# Patient Record
Sex: Female | Born: 1945 | Race: White | Hispanic: No | Smoking: Never smoker
Health system: Southern US, Community
[De-identification: ages and names within clinical notes are randomized; demographics above are authoritative.]

## PROBLEM LIST (undated history)

## (undated) DIAGNOSIS — J45909 Unspecified asthma, uncomplicated: Secondary | ICD-10-CM

## (undated) DIAGNOSIS — M25559 Pain in unspecified hip: Secondary | ICD-10-CM

## (undated) DIAGNOSIS — J309 Allergic rhinitis, unspecified: Secondary | ICD-10-CM

## (undated) DIAGNOSIS — K5730 Diverticulosis of large intestine without perforation or abscess without bleeding: Secondary | ICD-10-CM

## (undated) DIAGNOSIS — F329 Major depressive disorder, single episode, unspecified: Secondary | ICD-10-CM

## (undated) DIAGNOSIS — I10 Essential (primary) hypertension: Secondary | ICD-10-CM

## (undated) DIAGNOSIS — K219 Gastro-esophageal reflux disease without esophagitis: Secondary | ICD-10-CM

## (undated) DIAGNOSIS — F32A Depression, unspecified: Secondary | ICD-10-CM

## (undated) DIAGNOSIS — E785 Hyperlipidemia, unspecified: Secondary | ICD-10-CM

## (undated) HISTORY — DX: Gastro-esophageal reflux disease without esophagitis: K21.9

## (undated) HISTORY — DX: Essential (primary) hypertension: I10

## (undated) HISTORY — PX: APPENDECTOMY: SHX54

## (undated) HISTORY — PX: OTHER SURGICAL HISTORY: SHX169

## (undated) HISTORY — DX: Allergic rhinitis, unspecified: J30.9

## (undated) HISTORY — DX: Diverticulosis of large intestine without perforation or abscess without bleeding: K57.30

## (undated) HISTORY — DX: Hyperlipidemia, unspecified: E78.5

## (undated) HISTORY — DX: Pain in unspecified hip: M25.559

## (undated) HISTORY — DX: Unspecified asthma, uncomplicated: J45.909

---

## 1994-10-01 HISTORY — PX: TOTAL KNEE ARTHROPLASTY: SHX125

## 1994-10-01 HISTORY — PX: HYSTEROSCOPY: SHX211

## 1999-02-28 ENCOUNTER — Encounter: Admission: RE | Admit: 1999-02-28 | Discharge: 1999-05-29 | Payer: Self-pay | Admitting: *Deleted

## 1999-04-06 ENCOUNTER — Inpatient Hospital Stay (HOSPITAL_COMMUNITY): Admission: EM | Admit: 1999-04-06 | Discharge: 1999-04-08 | Payer: Self-pay | Admitting: *Deleted

## 1999-04-06 ENCOUNTER — Encounter: Payer: Self-pay | Admitting: *Deleted

## 1999-07-18 ENCOUNTER — Encounter: Payer: Self-pay | Admitting: Gastroenterology

## 1999-09-04 ENCOUNTER — Ambulatory Visit (HOSPITAL_COMMUNITY): Admission: RE | Admit: 1999-09-04 | Discharge: 1999-09-04 | Payer: Self-pay | Admitting: Gastroenterology

## 2000-07-02 ENCOUNTER — Encounter: Admission: RE | Admit: 2000-07-02 | Discharge: 2000-07-02 | Payer: Self-pay | Admitting: *Deleted

## 2000-07-02 ENCOUNTER — Encounter: Payer: Self-pay | Admitting: *Deleted

## 2001-09-26 ENCOUNTER — Other Ambulatory Visit: Admission: RE | Admit: 2001-09-26 | Discharge: 2001-09-26 | Payer: Self-pay | Admitting: *Deleted

## 2002-01-12 ENCOUNTER — Emergency Department (HOSPITAL_COMMUNITY): Admission: EM | Admit: 2002-01-12 | Discharge: 2002-01-12 | Payer: Self-pay | Admitting: Emergency Medicine

## 2002-01-12 ENCOUNTER — Encounter: Payer: Self-pay | Admitting: Emergency Medicine

## 2002-04-17 ENCOUNTER — Encounter: Admission: RE | Admit: 2002-04-17 | Discharge: 2002-04-17 | Payer: Self-pay | Admitting: *Deleted

## 2002-04-17 ENCOUNTER — Encounter: Payer: Self-pay | Admitting: *Deleted

## 2002-04-20 ENCOUNTER — Encounter: Payer: Self-pay | Admitting: *Deleted

## 2002-04-20 ENCOUNTER — Encounter: Admission: RE | Admit: 2002-04-20 | Discharge: 2002-04-20 | Payer: Self-pay | Admitting: *Deleted

## 2002-08-03 ENCOUNTER — Ambulatory Visit (HOSPITAL_COMMUNITY): Admission: RE | Admit: 2002-08-03 | Discharge: 2002-08-03 | Payer: Self-pay | Admitting: Gastroenterology

## 2003-05-18 ENCOUNTER — Encounter: Payer: Self-pay | Admitting: *Deleted

## 2003-05-18 ENCOUNTER — Encounter: Admission: RE | Admit: 2003-05-18 | Discharge: 2003-05-18 | Payer: Self-pay | Admitting: *Deleted

## 2003-10-02 HISTORY — PX: NECK SURGERY: SHX720

## 2004-05-23 ENCOUNTER — Encounter: Admission: RE | Admit: 2004-05-23 | Discharge: 2004-05-23 | Payer: Self-pay | Admitting: *Deleted

## 2004-06-14 ENCOUNTER — Ambulatory Visit (HOSPITAL_COMMUNITY): Admission: RE | Admit: 2004-06-14 | Discharge: 2004-06-15 | Payer: Self-pay | Admitting: Orthopaedic Surgery

## 2004-08-09 ENCOUNTER — Observation Stay (HOSPITAL_COMMUNITY): Admission: RE | Admit: 2004-08-09 | Discharge: 2004-08-10 | Payer: Self-pay | Admitting: Urology

## 2005-08-29 ENCOUNTER — Encounter: Payer: Self-pay | Admitting: Family Medicine

## 2005-12-07 ENCOUNTER — Encounter: Admission: RE | Admit: 2005-12-07 | Discharge: 2005-12-07 | Payer: Self-pay | Admitting: Family Medicine

## 2007-11-20 ENCOUNTER — Ambulatory Visit: Payer: Self-pay | Admitting: Internal Medicine

## 2008-03-01 DIAGNOSIS — K5730 Diverticulosis of large intestine without perforation or abscess without bleeding: Secondary | ICD-10-CM

## 2008-03-01 HISTORY — DX: Diverticulosis of large intestine without perforation or abscess without bleeding: K57.30

## 2008-03-16 ENCOUNTER — Ambulatory Visit: Payer: Self-pay | Admitting: Gastroenterology

## 2008-03-22 ENCOUNTER — Ambulatory Visit: Payer: Self-pay | Admitting: Gastroenterology

## 2010-07-04 ENCOUNTER — Encounter: Payer: Self-pay | Admitting: Gastroenterology

## 2010-07-11 ENCOUNTER — Encounter: Payer: Self-pay | Admitting: Gastroenterology

## 2010-08-17 DIAGNOSIS — R7309 Other abnormal glucose: Secondary | ICD-10-CM | POA: Insufficient documentation

## 2010-08-17 DIAGNOSIS — F329 Major depressive disorder, single episode, unspecified: Secondary | ICD-10-CM | POA: Insufficient documentation

## 2010-08-17 DIAGNOSIS — R5381 Other malaise: Secondary | ICD-10-CM | POA: Insufficient documentation

## 2010-08-17 DIAGNOSIS — E7800 Pure hypercholesterolemia, unspecified: Secondary | ICD-10-CM | POA: Insufficient documentation

## 2010-08-17 DIAGNOSIS — K429 Umbilical hernia without obstruction or gangrene: Secondary | ICD-10-CM | POA: Insufficient documentation

## 2010-08-17 DIAGNOSIS — R5383 Other fatigue: Secondary | ICD-10-CM

## 2010-08-17 DIAGNOSIS — K5730 Diverticulosis of large intestine without perforation or abscess without bleeding: Secondary | ICD-10-CM | POA: Insufficient documentation

## 2010-08-17 DIAGNOSIS — I10 Essential (primary) hypertension: Secondary | ICD-10-CM | POA: Insufficient documentation

## 2010-08-17 DIAGNOSIS — R109 Unspecified abdominal pain: Secondary | ICD-10-CM | POA: Insufficient documentation

## 2010-08-17 DIAGNOSIS — R112 Nausea with vomiting, unspecified: Secondary | ICD-10-CM

## 2010-08-21 ENCOUNTER — Encounter: Payer: Self-pay | Admitting: Cardiology

## 2010-08-22 ENCOUNTER — Ambulatory Visit: Payer: Self-pay | Admitting: Gastroenterology

## 2010-08-22 DIAGNOSIS — R1084 Generalized abdominal pain: Secondary | ICD-10-CM | POA: Insufficient documentation

## 2010-08-28 ENCOUNTER — Ambulatory Visit (HOSPITAL_COMMUNITY)
Admission: RE | Admit: 2010-08-28 | Discharge: 2010-08-28 | Payer: Self-pay | Source: Home / Self Care | Admitting: Gastroenterology

## 2010-09-13 ENCOUNTER — Telehealth: Payer: Self-pay | Admitting: Gastroenterology

## 2010-09-13 DIAGNOSIS — IMO0001 Reserved for inherently not codable concepts without codable children: Secondary | ICD-10-CM | POA: Insufficient documentation

## 2010-09-14 ENCOUNTER — Ambulatory Visit: Payer: Self-pay | Admitting: Cardiology

## 2010-09-14 DIAGNOSIS — R9431 Abnormal electrocardiogram [ECG] [EKG]: Secondary | ICD-10-CM

## 2010-10-16 ENCOUNTER — Ambulatory Visit (HOSPITAL_COMMUNITY)
Admission: RE | Admit: 2010-10-16 | Discharge: 2010-10-16 | Payer: Self-pay | Source: Home / Self Care | Attending: Cardiology | Admitting: Cardiology

## 2010-10-16 ENCOUNTER — Encounter (HOSPITAL_COMMUNITY): Admission: RE | Admit: 2010-10-16 | Payer: Self-pay | Source: Home / Self Care | Admitting: Cardiology

## 2010-10-16 ENCOUNTER — Ambulatory Visit
Admission: RE | Admit: 2010-10-16 | Discharge: 2010-10-16 | Payer: Self-pay | Source: Home / Self Care | Attending: Cardiology | Admitting: Cardiology

## 2010-10-16 ENCOUNTER — Ambulatory Visit: Admission: RE | Admit: 2010-10-16 | Discharge: 2010-10-16 | Payer: Self-pay | Source: Home / Self Care

## 2010-10-31 NOTE — Procedures (Signed)
Summary: [facility]   Imported By: Sherian Rein 08/23/2010 13:38:02  _____________________________________________________________________  External Attachment:    Type:   Image     Comment:   External Document

## 2010-10-31 NOTE — Assessment & Plan Note (Signed)
Summary: NAUSEA,[name]    History of Present Illness Visit Type: Initial Consult Primary GI MD: Sheryn Bison MD FACP FAGA Primary Provider: Herb Grays, MD Requesting Provider: Herb Grays, MD Chief Complaint: Periumbilical pain, episode of projectile vomiting(coffee ground color), nausea x 1 month History of Present Illness:   very nice Dawn Salinas who has had 5 weeks of constant periumbilical pain with mild nausea and one initial episode of projectile emesis. Her periumbilical pain is constant and is worse with lifting and relieved mostly by lying still. She's had no change in her bowel habits, melena, or hematochezia. There is no history of dyspepsia, reflux symptoms, or dysphagia.  She saw Dr. Collins Scotland in October and had normal blood work and CT scan of the abdomen and pelvis except for evidence of prior hysterectomy, appendectomy, and a right ovary been surgically removed. There was a periumbilical fascial defect noted with fat protrusion.  Patient has not had any name hepatobiliary problems or gallstones, but has not had ultrasound exam. Liver function tests, amylase, and lipase were normal in October. She is on NSAIDs for fibromyalgia but denies dyspepsia. Other problems have included mild hypertension, obesity, depression, and anxiety. She is on Zoloft and Xanax.   GI Review of Systems    Reports abdominal pain, nausea, and  vomiting.     Location of  Abdominal pain: lower abdomen.    Denies acid reflux, belching, bloating, chest pain, dysphagia with liquids, dysphagia with solids, heartburn, loss of appetite, vomiting blood, weight loss, and  weight gain.      Reports fecal incontinence and  hemorrhoids.     Denies anal fissure, black tarry stools, change in bowel habit, constipation, diarrhea, diverticulosis, heme positive stool, irritable bowel syndrome, jaundice, light color stool, liver problems, rectal bleeding, and  rectal pain.    Current  Medications (verified): 1)  Ditropan Xl 15 Mg Xr24h-Tab (Oxybutynin Chloride) .... Take One By Mouth Two Times A Day 2)  Lidoderm 5 % Ptch (Lidocaine) .... Apply Up To 3 Patches Every 12 Hours Then Remove For 12 Hours As Needed Pain 3)  Norvasc 5 Mg Tabs (Amlodipine Besylate) .... Take One By Mouth Once Daily 4)  Volteren 75mg  .... Take One By Mouth Two Times A Day For Ankle Pain As Needed 5)  Xanax 0.25 Mg Tabs (Alprazolam) .... Take One By Mouth Two Times A Day As Needed 6)  Zoloft 100 Mg Tabs (Sertraline Hcl) .... Take One By Mouth Two Times A Day  Allergies (verified): 1)  ! Codeine 2)  ! Ace Inhibitors  Past History:  Past medical, surgical, family and social histories (including risk factors) reviewed for relevance to current acute and chronic problems.  Past Medical History: Anxiety Disorder Arthritis Depression Fibromyalgia Hypertension  Past Surgical History: Reviewed history from 08/17/2010 and no changes required. cardiac cath 1/99 & 04/1999 right knee replacement Hysterectomy with right oophorectomy neck fusion Appendectomy  Family History: Reviewed history from 08/17/2010 and no changes required. Family History of Breast Cancer: maternal aunts X2 Family History of Colon Cancer: maternal aunt Family History of Heart Disease: Mother Family History of Kidney Disease:  Social History: Reviewed history from 08/17/2010 and no changes required. Patient gets regular exercise. Patient has never smoked.  Alcohol Use - no Illicit Drug Use - no Occupation: Retired Daily Caffeine Use  Review of Systems       The patient complains of allergy/sinus, back pain, depression-new, fatigue, headaches-new, thirst - excessive, urination - excessive, and urine leakage.  The  patient denies anemia, anxiety-new, arthritis/joint pain, blood in urine, breast changes/lumps, change in vision, confusion, cough, coughing up blood, fainting, fever, hearing problems, heart murmur, heart  rhythm changes, itching, menstrual pain, muscle pains/cramps, night sweats, nosebleeds, pregnancy symptoms, shortness of breath, skin rash, sleeping problems, sore throat, swelling of feet/legs, swollen lymph glands, thirst - excessive , urination - excessive , urination changes/pain, vision changes, and voice change.    Vital Signs:  Patient profile:   [age] year old Salinas Height:      63.5 inches Weight:      197.13 pounds BMI:     34.50 Pulse rate:   96 / minute Pulse rhythm:   regular BP sitting:   152 / 68  (left arm) Cuff size:   regular  Vitals Entered By: June McMurray CMA Duncan Dull) (August 22, 2010 1:23 PM)  Physical Exam  General:  Well developed, well nourished, no acute distress.healthy appearing.  healthy appearing.   Head:  Normocephalic and atraumatic. Eyes:  PERRLA, no icterus.exam deferred to patient's ophthalmologist.  exam deferred to patient's ophthalmologist.   Lungs:  Clear throughout to auscultation. Heart:  Regular rate and rhythm; no murmurs, rubs,  or bruits. Abdomen:  No Evidence of hepatosplenomegaly. There is a defect in the umbilicus more prominent with straight leg raising, also associated tenderness and reproduction of her pain. Bowel sounds are nonobstructive in quality. Rectal:  deferred Extremities:  No clubbing, cyanosis, edema or deformities noted. Neurologic:  Alert and  oriented x4;  grossly normal neurologically. Psych:  Alert and cooperative. Normal mood and affect.   Impression & Recommendations:  Problem # 1:  ABDOMINAL PAIN, GENERALIZED (ICD-789.07) Assessment Improved Symptoms and exam consistent with umbilical hernia. She had negative colonoscopy exam in June 2009. I have ordered upper abdominal ultrasound exam to exclude cholelithiasis. Surgical consultation is been requested.With her history of regular NSAID use, we'll go ahead and schedule endoscopy..AcipHex 20 mg a day prescribed empirically.  Problem # 2:  UMB HERNIA WITHOUT MENTION  OBSTRUCTION/GANGRENE (ICD-553.1) Assessment: Comment Only  Problem # 3:  HYPERTENSION (ICD-401.9) Assessment: Improved blood pressure today at 152/68. She is to continue Norvasc 5 mg a day. Patient does not smoke or abuse ethanol.  Patient Instructions: 1)  Copy sent to : Herb Grays, MD 2)  Your abdominal ultrasound is scheduled for 08/28/2010, please follow the seperate  3)  We will fax all of your records over to [facility] and we will contact you with an appt. after they review your records.  4)  The medication list was reviewed and reconciled.  All changed / newly prescribed medications were explained.  A complete medication list was provided to the patient / caregiver. 5)  Upper Endoscopy brochure given.  6)  Start AcipHex 20 mg a day.  Appended Document: Orders Update    Clinical Lists Changes  Orders: Added new Test order of [facility] ([facility]) - Signed      Appended Document: Orders Update    Clinical Lists Changes  Orders: Added new Test order of Ultrasound Abdomen (UAS) - Signed

## 2010-10-31 NOTE — Letter (Signed)
Summary: New Patient letter  [facility]  [address] [place], [place] [address]   Phone: [phone]  Fax: [phone]       07/11/2010 MRN: [id number]  [facility] [address] [place], [place]  [address]  Dear Dawn Salinas,  Welcome to the Gastroenterology Division at [facility].    You are scheduled to see Dr.  Jarold Motto on 08-22-10 at 1:30pm on the 3rd floor at [facility], [address]  We ask that you try to arrive at our office 15 minutes prior to your appointment time to allow for check-in.  We would like you to complete the enclosed self-administered evaluation form prior to your visit and bring it with you on the day of your appointment.  We will review it with you.  Also, please bring a complete list of all your medications or, if you prefer, bring the medication bottles and we will list them.  Please bring your insurance card so that we may make a copy of it.  If your insurance requires a referral to see a specialist, please bring your referral form from your primary care physician.  Co-payments are due at the time of your visit and may be paid by cash, check or credit card.     Your office visit will consist of a consult with your physician (includes a physical exam), any laboratory testing he/she may order, scheduling of any necessary diagnostic testing (e.g. x-ray, ultrasound, CT-scan), and scheduling of a procedure (e.g. Endoscopy, Colonoscopy) if required.  Please allow enough time on your schedule to allow for any/all of these possibilities.    If you cannot keep your appointment, please call [phone] to cancel or reschedule prior to your appointment date.  This allows [place] the opportunity to schedule an appointment for another patient in need of care.  If you do not cancel or reschedule by 5 p.m. the business day prior to your appointment date, you will be charged a $50.00 late cancellation/no-show fee.    Thank you for choosing  [facility] for your medical needs.  We appreciate the opportunity to care for you.  Please visit [place] at our website  to learn more about our practice.                     Sincerely,                                                             The Gastroenterology Division

## 2010-10-31 NOTE — Letter (Signed)
Summary: [facility]   Imported By: Lester [place] 08/29/2010 07:33:03  _____________________________________________________________________  External Attachment:    Type:   Image     Comment:   External Document

## 2010-11-02 NOTE — Assessment & Plan Note (Signed)
Summary: np6/ abn ekg. pt has medicare.    Referring Provider:  Herb Grays, MD Primary Provider:  Herb Grays, MD  CC:  dizziness and sob.  History of Present Illness: 65 year old female her evaluation of abnormal electrocardiogram. Patient with 2 prior cardiac catheterizations in 1999 or 2000 which were normal by her report. Patient has noted dyspnea on exertion for approximately one month. There is no orthopnea, PND, pedal edema or syncope. She does not have exertional chest pain. She does occasionally have epigastric pain that she felt may have been related to reflux. It does not radiate. It is not exertional. It lasts 10-15 minutes and resolved spontaneously. There is no associated symptoms. Note she also has an umbilical hernia that will require repair. We are also asked to evaluate preoperatively.  Current Medications (verified): 1)  Ditropan Xl 15 Mg Xr24h-Tab (Oxybutynin Chloride) .... Take One By Mouth Two Times A Day 2)  Lidoderm 5 % Ptch (Lidocaine) .... Apply Up To 3 Patches Every 12 Hours Then Remove For 12 Hours As Needed Pain 3)  Norvasc 5 Mg Tabs (Amlodipine Besylate) .... Take One By Mouth Once Daily 4)  Volteren 75mg  .... Take One By Mouth Two Times A Day For Ankle Pain As Needed 5)  Xanax 0.25 Mg Tabs (Alprazolam) .... Take One By Mouth Two Times A Day As Needed 6)  Zoloft 100 Mg Tabs (Sertraline Hcl) .... Take One By Mouth Two Times A Day  Allergies: 1)  ! Codeine 2)  ! Ace Inhibitors  Past History:  Past Medical History: HYPERTENSION  UMB HERNIA HYPERCHOLESTEROLEMIA  DEPRESSION  DIVERTICULOSIS, COLON  Arthritis Fibromyalgia  Past Surgical History: Reviewed history from 08/17/2010 and no changes required. cardiac cath 1/99 & 04/1999 right knee replacement Hysterectomy with right oophorectomy neck fusion Appendectomy  Family History: Family History of Breast Cancer: maternal aunts X2 Family History of Colon Cancer: maternal aunt Family History of Heart  Disease: Mother (CABG at age 87) Family History of Kidney Disease:  Social History: Reviewed history from 08/22/2010 and no changes required. Patient gets regular exercise. Patient has never smoked.  Alcohol Use - no Illicit Drug Use - no Occupation: Retired Daily Caffeine Use Married   Review of Systems       no fevers or chills, productive cough, hemoptysis, dysphasia, odynophagia, melena, hematochezia, dysuria, hematuria, rash, seizure activity, orthopnea, PND, pedal edema, claudication. Remaining systems are negative.   Vital Signs:  Patient profile:   65 year old female Height:      63 inches Weight:      193 pounds BMI:     34.31 Pulse rate:   96 / minute Resp:     16 per minute BP sitting:   147 / 82  (left arm)  Vitals Entered By: Kem Parkinson (September 14, 2010 12:09 PM)  Physical Exam  General:  Well developed/well nourished in NAD Skin warm/dry Patient not depressed No peripheral clubbing Back-normal HEENT-normal/normal eyelids Neck supple/normal carotid upstroke bilaterally; no bruits; no JVD; no thyromegaly chest - CTA/ normal expansion CV - RRR/normal S1 and S2; no  rubs or gallops;  PMI nondisplaced; 1/6 systolic ejection murmur. Abdomen -NT/ND, no HSM, no mass, + bowel sounds, no bruit 2+ femoral pulses, no bruits Ext-no edema, chords, 2+ DP Neuro-grossly nonfocal     Impression & Recommendations:  Problem # 1:  PREOPERATIVE EXAMINATION (ICD-V72.84) Patient with multiple risk factors. Occasional epigastric pain that does not sound cardiac. I will schedule a stress echocardiogram for risk stratification. If  normal okay to proceed with surgery.  Problem # 2:  ABNORMAL ELECTROCARDIOGRAM (ICD-794.31) As per #1. Her updated medication list for this problem includes:    Norvasc 5 Mg Tabs (Amlodipine besylate) .Marland Kitchen... Take one by mouth once daily  Orders: Stress Echo (Stress Echo)  Problem # 3:  HYPERTENSION (ICD-401.9) Blood pressure  controlled on present medications. Her updated medication list for this problem includes:    Norvasc 5 Mg Tabs (Amlodipine besylate) .Marland Kitchen... Take one by mouth once daily  Problem # 4:  HYPERCHOLESTEROLEMIA (ICD-272.0) Management per primary care.  Problem # 5:  FIBROMYALGIA (ICD-729.1)  Patient Instructions: 1)  Your physician recommends that you schedule a follow-up appointment in: as needed with Dr. Jens Som 2)  Your physician recommends that you continue on your current medications as directed. Please refer to the Current Medication list given to you today. 3)  Your physician has requested that you have a stress echocardiogram. For further information please visit [email or web address]  Please follow instruction sheet as given.

## 2010-11-02 NOTE — Progress Notes (Signed)
Summary: [place] results   Phone Note Call from Patient Call back at Home Phone [phone]   Caller: Patient Call For: Dr. Jarold Motto Reason for Call: Talk to Nurse Summary of Call: would like to discuss [place] results Initial call taken by: Vallarie Mare,  September 13, 2010 10:21 AM  Follow-up for Phone Call        patient advised and reviewed [place] report with her.  All questions answered. Darcey Nora RN, [facility]  September 13, 2010 10:38 AM

## 2010-11-02 NOTE — Letter (Signed)
Summary: [facility]   Imported By: Marylou Mccoy 10/18/2010 14:52:19  _____________________________________________________________________  External Attachment:    Type:   Image     Comment:   External Document

## 2011-02-16 NOTE — Op Note (Signed)
NAMEEVAMARIA, DETORE                 ACCOUNT NO.:  [id number]   MEDICAL RECORD NO.:  [id number]          PATIENT TYPE:  AMB   LOCATION:  DAY                          FACILITY:  [facility]   PHYSICIAN:  Valetta Fuller, M.D.  DATE OF BIRTH:  1945/10/23   DATE OF PROCEDURE:  DATE OF DISCHARGE:                                 OPERATIVE REPORT   PREOPERATIVE DIAGNOSIS:  Stress incontinence.   POSTOPERATIVE DIAGNOSIS:  Stress incontinence.   PROCEDURE PERFORMED:  Transobturator suburethral sling.   SURGEON:  Valetta Fuller, M.D.   ANESTHESIA:  General anesthesia.   INDICATIONS FOR PROCEDURE:  Ms. Mcquown is a 65 year old female.  She came to  see me recently because of some longstanding urinary incontinence.  She has  had mixed urinary incontinence.  She has had incontinence for eight to 10  years and this is progressively worsening.  She has both urge as well as  stress base leakage with worst stress incontinence and urge incontinence.  She has leakage with most stress events.  She also has moderate urgency with  urge incontinence.  She has been on Ditropan XL 10 mg which has helped with  some of her overactive bladder symptoms but she persists in having  problematic stress incontinence.  On clinical examination she had urethral  high mobility with a mild cystocele.  She did have objective stress  incontinence with positive Marshall's test.  She appeared to understand the  advantages and disadvantages of surgical approach for this.  She elected to  proceed and full informed consent was obtained.   DESCRIPTION OF PROCEDURE:  The patient was brought to the operating room  where she had successful induction of general anesthesia.  She was placed in  the mid lithotomy position and prepped and draped in the usual manner.  A  Foley catheter was placed and the bladder was drained.  A weighted vaginal  speculum was utilized.  A very small 2 cm incision was made over the mid  urethra.  Vaginal.  planes were dissected to allow [place] to palpate laterally.  Once exposure had been obtained, we made two small poke incisions  approximately at the level of the clitoris 4.5 cm lateral to the clitoris on  both sides.  The needles were then passed with direct digital finger control  to either side of the urethra.  The transobturator sling tape was then  passed into each needle and positioned at the mid urethra.  A right angle  clamp was used to ascertain proper tension.  The redundant sling tape was  then transected at the level of the skin.  The vaginal incision was closed  with a running 2-0 Vicryl suture.  Some Estrace vaginal packing was then  utilized.  The obturator incisions were closed with Dermabond.  The Foley  catheter was left indwelling.  The patient appeared to tolerate the  procedure well.  Sponge and needle counts were correct and she was brought  to the recovery room in stable condition.     DSG/MEDQ  D:  08/09/2004  T:  08/09/2004  Job:  [id number]

## 2011-02-16 NOTE — H&P (Signed)
Dawn Salinas, Dawn Salinas                           ACCOUNT NO.:  [id number]   MEDICAL RECORD NO.:  [id number]                   PATIENT TYPE:  INP   LOCATION:                                       FACILITY:  [facility]   PHYSICIAN:  Sharolyn Douglas, M.D.                     DATE OF BIRTH:  [birth year]   DATE OF ADMISSION:  06/14/2004  DATE OF DISCHARGE:                                HISTORY & PHYSICAL   CHIEF COMPLAINT:  Pain in my neck and shoulders.   HISTORY OF PRESENT ILLNESS:  This 65 year old female who has been seen for  continuing progressive problems referred through the courtesy of Dr. Valma Cava for neck pain and radiculitis.  She has seen Dr. Thomasena Edis primarily  for her knee but her neck was bothering her to such a great degree that she  was sent to [place] for evaluation.  This patient's pain has been going on for  some time with radiation into her upper extremities.  Anti-inflammatories  has provided her with minimal relief.  She had a right C5 selective nerve  root block which gave her very satisfactory results, but only for about 10  days.  She has difficulty sleeping at night.  Has to rely on analgesics.  She has radiation from the neck into the right lower extremity to below the  elbow.  She has had an MRI of her cerebral cortex which was normal and MRI  of the cervical spine showed spondylosis without stenosis at C3/C5.  However, C4-5 showed a right paracentral disk extrusion with flattening.  C5-  C6 moderate biforaminal narrowing was seen as well.  After much discussion  and the complications of surgery, but to include the benefits of surgery,  were discussed with the patient it was decided to go ahead with this  procedure.   This is a very active lady.  She travels to see her grandchildren in  [place], [place] and is quite frustrated with her overall situation.  Dr.  Dara Hoyer of [facility] is her primary physician.   ALLERGIES:  HYDROCODONE which causes  itching.   CURRENT MEDICATIONS:  1.  Cozaar 50 mg one daily.  2.  Zoloft 100 mg one daily.  3.  Xanax XR p.r.n.  4.  Tricor, but is unsure of the milligram.   PAST MEDICAL HISTORY:  1.  Hypertension.  2.  Depression.   PAST SURGICAL HISTORY:  1.  Total knee replacement arthroplasty.  2.  Appendectomy.  3.  Hysterectomy.  4.  Laser eye surgery.  5.  Heart catheterization x2 for palpitations and those were normal.  She      is currently being treated for hypertension.   FAMILY HISTORY:  Positive for heart disease in the mother as well as  hypertension in her family.  Cancer is present in the family in the  mother's  sisters with breast, colon, and bone cancer.  Her mother died of a stroke in  92.   SOCIAL HISTORY:  The patient is married.  She is retired.  She has no intake  of alcohol or tobacco products.  She has two children and her husband will  be caregiver after surgery.   REVIEW OF SYSTEMS:  CNS:  No seizures, shoulder paralysis, numbness, or  double vision but the patient does have radiculopathy as described in  present illness above.  She also has some anxiety/depression.  CARDIOVASCULAR:  No chest pain.  No angina.  No orthopnea.  RESPIRATORY:  No  productive cough.  No hemoptysis.  No shortness of breath.  GASTROINTESTINAL:  No nausea, vomiting, melena, bloody stools.  GENITOURINARY:  The patient has urgency and nocturia.  MUSCULOSKELETAL:  Primarily in present illness.   PHYSICAL EXAMINATION:  GENERAL:  Alert, cooperative, pleasant, well groomed  65 year old white female.  VITAL SIGNS:  Blood pressure 160/82, pulse 80, respirations 12.  HEENT:  Normocephalic.  PERRLA.  EOM intact.  Oropharynx is clear.  NECK:  Range of motion of cervical spine is quite uncomfortable,  particularly on extension.  CHEST:  Clear to auscultation.  No rhonchi.  No rales.  No wheezes.  HEART:  Regular rate and rhythm.  No murmurs are heard.  ABDOMEN:  Soft, nontender.  Liver, spleen  not felt.  GENITALIA:  Not done.  Not pertinent to present illness.  RECTAL:  Not done.  Not pertinent to present illness.  PELVIC:  Not done.  Not pertinent to present illness.  BREASTS:  Not done.  Not pertinent to present illness.  EXTREMITIES:  The patient has giveaway tenderness in the upper extremity  more so on the right than the left with muscle testing.  Sensation is intact  bilaterally.   ADMITTING DIAGNOSES:  1.  Cervical spondylitic radiculopathy.  2.  Hypertension.  3.  Anxiety/depression.  4.  Left knee osteoarthritis.  5.  Status post right total knee replacement arthroplasty.   PLAN:  The patient will be admitted for anterior cervical diskectomy and  fusion C4-C5, C5-C6 with Allograft and plate.      Dooley L. Cherlynn June.                 Sharolyn Douglas, M.D.    DLU/MEDQ  D:  05/31/2004  T:  05/31/2004  Job:  [id number]   cc:   Teena Irani. Arlyce Dice, M.D.  [id number]  [place]  [place] [address]  Fax: [phone]

## 2011-02-16 NOTE — Op Note (Signed)
NAMECLYDEAN, POSAS                           ACCOUNT NO.:  [id number]   MEDICAL RECORD NO.:  [id number]                   PATIENT TYPE:  AMB   LOCATION:  ENDO                                 FACILITY:  [facility]   PHYSICIAN:  Anselmo Rod, M.D.               DATE OF BIRTH:  1946-05-15   DATE OF PROCEDURE:  08/03/2002  DATE OF DISCHARGE:                                 OPERATIVE REPORT   PROCEDURE PERFORMED:  Screening colonoscopy.   ENDOSCOPIST:  Charna Elizabeth, M.D.   INSTRUMENT USED:  Pediatric adjustable Olympus colonoscope.   INDICATIONS FOR PROCEDURE:  A 65 year old white female undergoing screening  colonoscopy for a family history of colon cancer and breast cancer.  Rule  out colonic polyps, masses, hemorrhoids, etc.   PREPROCEDURE PREPARATION:  Informed consent was procured from the patient.  The patient was fasted for eight hours prior to the procedure and prepped  with a bottle of magnesium citrate and a gallon of NuLytely the night prior  to the procedure.   PREPROCEDURE PHYSICAL:   GENERAL:  The patient has stable vital signs.   NECK:  Supple.   CHEST:  Clear to auscultation.  S1, S2 regular.   ABDOMEN:  Soft with normal bowel sounds.   DESCRIPTION OF PROCEDURE:  The patient was placed in the left lateral  decubitus position and sedated with 70 mg of Demerol and 7 mg of Versed  intravenously.  Once the patient was adequately sedated and maintained on  low-flow oxygen and continuous cardiac monitoring, the Olympus video  colonoscope was advanced from the rectum to the cecum and terminal ileum  without difficulty.  The patient had a fairly good prep.  A few scattered  diverticula were seen throughout the colon with prominent internal  hemorrhoids seen on retroflexion.  A small external hemorrhoid was seen on  anal inspection.  The terminal ileum was appeared normal.   IMPRESSION:  1. Few scattered diverticula throughout the colon with more prominent  changes in the left colon.  2. Small internal and external hemorrhoids.  3. Normal-appearing terminal ileum.  4. No masses or polyps seen.   RECOMMENDATIONS:  1. A high-fiber diet has been discussed with the patient.  Some brochures     have been given to her for education.  2.     Repeat colorectal cancer screening recommended for in the next five years     considering her family history of colon cancer unless she develops any     abnormal symptoms in the interim.  3. Outpatient follow-up on a p.r.n. basis.                                                Anselmo Rod, M.D.    JNM/MEDQ  D:  08/03/2002  T:  08/03/2002  Job:  [id number]   cc:   Teena Irani. Arlyce Dice, M.D.  [address]  [facility]  [place] [address]  Fax: [phone]   Andres Ege, M.D.  [address]  [place]  [place] [address]  Fax: [phone]

## 2011-02-16 NOTE — Op Note (Signed)
Dawn Salinas, BAZAR                           ACCOUNT NO.:  [id number]   MEDICAL RECORD NO.:  [id number]                   PATIENT TYPE:  OIB   LOCATION:  2550                                 FACILITY:  [facility]   PHYSICIAN:  Sharolyn Douglas, M.D.                     DATE OF BIRTH:  01/29/1946   DATE OF PROCEDURE:  06/14/2004  DATE OF DISCHARGE:                                 OPERATIVE REPORT   DIAGNOSIS:  Cervical spondylotic radiculopathy.   PROCEDURE:  1.  Anterior cervical diskectomy, C4-5, C5-6.  2.  Anterior cervical arthrodesis, C4-5, C5-6.  Placement of two allograft      prosthesis spacers packed with local autogenous bone graft.  3.  Anterior cervical plating, C4-C6 using the spinal concept system.   SURGEON:  Sharolyn Douglas, M.D.   ASSISTANT:  Verlin Fester, P.A.   ANESTHESIA:  General endotracheal.   COMPLICATIONS:  None.   INDICATIONS:  The patient is a 65 year old, pleasant female with persistent  neck and bilateral upper extremity pain, right greater than left secondary  to cervical spondylotic radiculopathy.  The worst levels are at C5-6 and C5-  6.  She does have spondylotic changes at C6-7, as well, but it is felt that  the majority of the symptoms at this point are related to the cephalad  levels.  Risks, benefits, alternatives were reviewed.  She has elected to  undergo ACDF.  She knows the risks of adjacent segment problems requiring  additional surgery at C3-4 and C6-7.   PROCEDURE:  The patient was properly identified in the holding area, taken  to the operating room and underwent general endotracheal anesthesia without  difficulty and given prophylactic IV antibiotics.  She was carefully  positioned on the operating room table with her neck in slight extension.  Five pounds alter-traction applied.  The neck was prepped and draped in  usual sterile fashion.  A 4 cm transverse incision was made to the left side  at the level of the thyroid cartilage.  Dissection  was carried sharply  through the platysma.  The interval between the SCM and strap muscles  medially was developed down to the prevertebral space.  The esophagus,  trachea and carotid sheath were identified and protected at all times.  The  C4-5 level was easily identifiable by the large anterior osteophyte.  Spinal  needle placed.  X-ray taken to confirm location.  Longus colli muscle  elevated out over the C4-5, C5-6 disk spaces bilaterally.  Deep __________  retractor placed.  The large anterior overhanging osteophytes were removed  with a Leksell rongeur at both levels.  Radical diskectomy carried back to  the posterior longitudinal ligament.  We encountered large uncovertebral  spurring as well as very degenerative disk material.  Caspar distraction  pins were placed in the C4, C5 and C6 vertebral bodies.  Gentle distraction  applied.  High-speed bur was used to take down the cartilaginous end plates  as well as the uncovertebral joints and posterior vertebral margins.  The 2  mm Kerrison used to undercut the vertebral margins and complete wide  foraminotomies bilaterally.  The posterior longitudinal ligament was taken  down at both levels.  At C4-5, there was a chronic-appearing disk rupture  which lateralized to the right side.  This was removed with a variety of  curets and micro Kerrisons.  We felt we had a good decompression of the  spinal canal as well as foramen at both levels.  The wound was irrigated.  Bleeding was controlled with bipolar electrocautery and Gelfoam.  We then  placed 6 mm allograft prosthesis spacers which had been packed with local  bone graft obtained from the drill shavings.  These grafts were carefully  countersunk 1 mm.  We then placed the 40 mm spinal concepts anterior  cervical plate with six 12 mm locking screws.  We ensured the locking  mechanism engaged.  We had excellent screw purchase.  The wound was  irrigated and the esophagus, trachea, carotid  sheath examined and there were  no apparent injuries.  The deep Penrose drain was left.  Platysma was closed  with interrupted 2-0 Vicryl, subcutaneous layer closed with interrupted 3-0  Vicryl followed by a running 4-0 subcuticular Vicryl suture on the skin  edges.  Benzoin and Steri-Strips placed.  Sterile dressing applied.  Soft  collar placed.  The patient was extubated without difficulty and transferred  to recovery in stable condition able to move her upper and lower  extremities.                                               Sharolyn Douglas, M.D.    MC/MEDQ  D:  06/14/2004  T:  06/14/2004  Job:  [id number]

## 2011-10-01 ENCOUNTER — Other Ambulatory Visit: Payer: Self-pay | Admitting: Family Medicine

## 2011-10-01 DIAGNOSIS — R110 Nausea: Secondary | ICD-10-CM

## 2011-10-01 DIAGNOSIS — R1012 Left upper quadrant pain: Secondary | ICD-10-CM

## 2011-10-01 DIAGNOSIS — R1011 Right upper quadrant pain: Secondary | ICD-10-CM

## 2011-10-04 ENCOUNTER — Ambulatory Visit
Admission: RE | Admit: 2011-10-04 | Discharge: 2011-10-04 | Disposition: A | Discharge status: Home / Self Care | Payer: Medicare Other | Source: Ambulatory Visit | Attending: Family Medicine | Admitting: Family Medicine

## 2011-10-04 DIAGNOSIS — R110 Nausea: Secondary | ICD-10-CM

## 2011-10-04 DIAGNOSIS — R1011 Right upper quadrant pain: Secondary | ICD-10-CM

## 2011-10-04 DIAGNOSIS — R1012 Left upper quadrant pain: Secondary | ICD-10-CM

## 2011-12-06 ENCOUNTER — Other Ambulatory Visit: Payer: Self-pay | Admitting: Family Medicine

## 2011-12-06 DIAGNOSIS — N63 Unspecified lump in unspecified breast: Secondary | ICD-10-CM

## 2011-12-13 ENCOUNTER — Inpatient Hospital Stay: Admission: RE | Admit: 2011-12-13 | Payer: Medicare Other | Source: Ambulatory Visit

## 2011-12-21 ENCOUNTER — Other Ambulatory Visit: Payer: Medicare Other

## 2012-10-23 ENCOUNTER — Ambulatory Visit (HOSPITAL_BASED_OUTPATIENT_CLINIC_OR_DEPARTMENT_OTHER)
Admission: RE | Admit: 2012-10-23 | Discharge: 2012-10-23 | Disposition: A | Discharge status: Home / Self Care | Payer: Medicare Other | Source: Ambulatory Visit | Attending: Critical Care Medicine | Admitting: Critical Care Medicine

## 2012-10-23 ENCOUNTER — Ambulatory Visit (INDEPENDENT_AMBULATORY_CARE_PROVIDER_SITE_OTHER): Payer: Medicare Other | Admitting: Critical Care Medicine

## 2012-10-23 ENCOUNTER — Encounter: Payer: Self-pay | Admitting: Critical Care Medicine

## 2012-10-23 VITALS — BP 156/74 | HR 95 | Temp 98.3°F | Ht 63.0 in | Wt 203.0 lb

## 2012-10-23 DIAGNOSIS — R059 Cough, unspecified: Secondary | ICD-10-CM

## 2012-10-23 DIAGNOSIS — R05 Cough: Secondary | ICD-10-CM | POA: Insufficient documentation

## 2012-10-23 DIAGNOSIS — J189 Pneumonia, unspecified organism: Secondary | ICD-10-CM

## 2012-10-23 MED ORDER — PREDNISONE 10 MG PO TABS: ORAL_TABLET | ORAL | Status: DC

## 2012-10-23 MED ORDER — BENZONATATE 100 MG PO CAPS: ORAL_CAPSULE | ORAL | Status: DC

## 2012-10-23 MED ORDER — OMEPRAZOLE 20 MG PO CPDR: 20.0000 mg | DELAYED_RELEASE_CAPSULE | Freq: Every day | ORAL | Status: DC

## 2012-10-23 MED ORDER — DEXTROMETHORPHAN POLISTIREX 30 MG/5ML PO LQCR: ORAL | Status: DC

## 2012-10-23 NOTE — Progress Notes (Addendum)
Subjective:    Patient ID: Dawn Salinas, female    DOB: [birth year], 67 y.o.   MRN: [id number]  HPI Comments: Cough chronic since Aug 2013. Waited on the flu vaccine. Pt has episodes coughing and cannot breath,  Deep barky cough.  Started with ?bronchitis and Rx neb med, inhaler SABA, depomedrol, Zpak.  Pt ? Got better then, 10days ago worsened again. Dyspneic as well   CXR done showed RLL.  Rx levaquin 10days. Now has finished, ? If has helped  Cough This is a recurrent problem. The current episode started more than 1 month ago. The problem has been waxing and waning. The problem occurs every few hours. The cough is productive of sputum (white and foamy now, ? discolored in the past). Associated symptoms include chest pain, chills, heartburn, myalgias, shortness of breath and wheezing. Pertinent negatives include no ear congestion, ear pain, fever, headaches, hemoptysis, nasal congestion, postnasal drip, rash, rhinorrhea, sore throat, sweats or weight loss. Associated symptoms comments: Chest tightness and heaviness. The symptoms are aggravated by cold air, exercise and fumes (chokes on food). She has tried a beta-agonist inhaler and steroid inhaler for the symptoms. The treatment provided moderate relief. Her past medical history is significant for bronchitis and pneumonia. There is no history of asthma, bronchiectasis, COPD, emphysema or environmental allergies.   Past Medical History  Diagnosis Date  . Hypertension   . Asthma   . Allergic rhinitis   . Hyperlipidemia      Family History  Problem Relation Age of Onset  . Heart disease Mother   . Asthma Sister   . Emphysema Sister      History   Social History  . Marital Status: Married    Spouse Name: N/A    Number of Children: N/A  . Years of Education: N/A   Occupational History  . Not on file.   Social History Main Topics  . Smoking status: Never Smoker   . Smokeless tobacco: Never Used  . Alcohol Use: No  . Drug Use: No   . Sexually Active:    Other Topics Concern  . Not on file   Social History Narrative  . No narrative on file     Allergies  Allergen Reactions  . Ace Inhibitors     REACTION: cough  . Codeine     REACTION: rash, itching, hives     No outpatient prescriptions prior to visit.    Last reviewed on 10/23/2012  2:35 PM by Storm Frisk, MD     Review of Systems  Constitutional: Positive for chills and fatigue. Negative for fever, weight loss, diaphoresis, activity change, appetite change and unexpected weight change.  HENT: Positive for congestion and voice change. Negative for hearing loss, ear pain, nosebleeds, sore throat, facial swelling, rhinorrhea, sneezing, mouth sores, trouble swallowing, neck pain, neck stiffness, dental problem, postnasal drip, sinus pressure, tinnitus and ear discharge.   Eyes: Negative for photophobia, discharge, itching and visual disturbance.  Respiratory: Positive for cough, choking, chest tightness, shortness of breath and wheezing. Negative for apnea, hemoptysis and stridor.   Cardiovascular: Positive for chest pain. Negative for palpitations and leg swelling.  Gastrointestinal: Positive for heartburn. Negative for nausea, vomiting, abdominal pain, constipation, blood in stool and abdominal distention.  Genitourinary: Positive for urgency. Negative for dysuria, frequency, hematuria, flank pain, decreased urine volume and difficulty urinating.  Musculoskeletal: Positive for myalgias, back pain, joint swelling and arthralgias. Negative for gait problem.  Skin: Negative for color change, pallor and  rash.  Neurological: Positive for weakness. Negative for dizziness, tremors, seizures, syncope, speech difficulty, light-headedness, numbness and headaches.  Hematological: Negative for environmental allergies and adenopathy. Does not bruise/bleed easily.  Psychiatric/Behavioral: Negative for confusion, sleep disturbance and agitation. The patient is not  nervous/anxious.        Objective:   Physical Exam  Filed Vitals:   10/23/12 1415  BP: 156/74  Pulse: 95  Temp: 98.3 F (36.8 C)  TempSrc: Oral  Height: 5\' 3"  (1.6 m)  Weight: 203 lb (92.08 kg)  SpO2: 96%    Gen: Pleasant, well-nourished, in no distress,  normal affect  ENT: No lesions,  mouth clear,  oropharynx clear, no postnasal drip  Neck: No JVD, no TMG, no carotid bruits  Lungs: No use of accessory muscles, no dullness to percussion, prominent pseudo-wheeze   Cardiovascular: RRR, heart sounds normal, no murmur or gallops, no peripheral edema  Abdomen: soft and NT, no HSM,  BS normal  Musculoskeletal: No deformities, no cyanosis or clubbing  Neuro: alert, non focal  Skin: Warm, no lesions or rashes  Dg Chest 2 View  10/23/2012  *RADIOLOGY REPORT*  Clinical Data: Cough for 10 days.  CHEST - 2 VIEW  Comparison: PA and lateral chest 06/13/2004.  Findings: The lungs are clear.  Heart size normal.  No pneumothorax or pleural effusion.  Postoperative change of cervical fusion noted.  IMPRESSION: No acute disease.   Original Report Authenticated By: Holley Dexter, M.D.    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 amLODipine (NORVASC) 10 MG tablet Take 1 tablet by mouth Daily.      . celecoxib (CELEBREX) 100 MG capsule Take 100 mg  by mouth 2 (two) times daily.      . sertraline (ZOLOFT) 100 MG tablet Take 2 tablets by mouth Daily.      . solifenacin (VESICARE) 5 MG tablet Take 10 mg by mouth daily.      . [DISCONTINUED] albuterol (PROVENTIL HFA;VENTOLIN HFA) 108 (90 BASE) MCG/ACT inhaler Inhale 2 puffs into the lungs every 6 (six) hours as needed.      . [DISCONTINUED] albuterol (PROVENTIL) (2.5 MG/3ML) 0.083% nebulizer solution Take 2.5 mg by nebulization every 6 (six) hours as needed.      . [DISCONTINUED] budesonide-formoterol (SYMBICORT) 160-4.5 MCG/ACT inhaler Inhale 2 puffs into the lungs daily.      . benzonatate (TESSALON) 100 MG capsule Take per cough protocol  90 capsule  4  . dextromethorphan (DELSYM) 30 MG/5ML liquid Take per cough protocol  89 mL  0  . omeprazole (PRILOSEC) 20 MG capsule Take 1 capsule (20 mg total) by mouth daily.  30 capsule  4  . predniSONE (DELTASONE) 10 MG tablet Take 4 for two days three for two days two for two days one for two days  20 tablet  0

## 2012-10-23 NOTE — Patient Instructions (Addendum)
Stop symbicort  And albuterol  You do not have asthma or emphysema Start omeprazole one daily Start prednisone 10mg  Take 4 for two days three for two days two for two days one for two days Follow cough protocol with benzonatate and delsym Follow reflux diet Return 6 weeks

## 2012-10-23 NOTE — Progress Notes (Signed)
Quick Note:  Notify the patient that the Xray is stable and no pneumonia No change in medications are recommended. Continue current meds as prescribed at last office visit ______ 

## 2012-10-24 NOTE — Assessment & Plan Note (Signed)
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

## 2012-10-24 NOTE — Progress Notes (Signed)
Quick Note:  Called, spoke with pt. Informed her of cxr results and recs per Dr. Wright. She verbalized understanding and voiced no further questions or concerns at this time. ______ 

## 2012-10-31 DIAGNOSIS — K219 Gastro-esophageal reflux disease without esophagitis: Secondary | ICD-10-CM

## 2012-10-31 HISTORY — DX: Gastro-esophageal reflux disease without esophagitis: K21.9

## 2012-11-10 ENCOUNTER — Encounter: Payer: Self-pay | Admitting: Nurse Practitioner

## 2012-11-10 DIAGNOSIS — F418 Other specified anxiety disorders: Secondary | ICD-10-CM | POA: Insufficient documentation

## 2012-11-10 DIAGNOSIS — N3946 Mixed incontinence: Secondary | ICD-10-CM

## 2012-11-29 DIAGNOSIS — M25559 Pain in unspecified hip: Secondary | ICD-10-CM

## 2012-11-29 HISTORY — DX: Pain in unspecified hip: M25.559

## 2012-12-01 ENCOUNTER — Emergency Department (HOSPITAL_COMMUNITY): Payer: Medicare Other

## 2012-12-01 ENCOUNTER — Emergency Department (HOSPITAL_COMMUNITY)
Admission: EM | Admit: 2012-12-01 | Discharge: 2012-12-01 | Disposition: A | Discharge status: Home / Self Care | Payer: Medicare Other | Attending: Emergency Medicine | Admitting: Emergency Medicine

## 2012-12-01 ENCOUNTER — Encounter (HOSPITAL_COMMUNITY): Payer: Self-pay | Admitting: *Deleted

## 2012-12-01 DIAGNOSIS — J45909 Unspecified asthma, uncomplicated: Secondary | ICD-10-CM | POA: Insufficient documentation

## 2012-12-01 DIAGNOSIS — Z8709 Personal history of other diseases of the respiratory system: Secondary | ICD-10-CM | POA: Insufficient documentation

## 2012-12-01 DIAGNOSIS — M76899 Other specified enthesopathies of unspecified lower limb, excluding foot: Secondary | ICD-10-CM | POA: Insufficient documentation

## 2012-12-01 DIAGNOSIS — Z79899 Other long term (current) drug therapy: Secondary | ICD-10-CM | POA: Insufficient documentation

## 2012-12-01 DIAGNOSIS — E785 Hyperlipidemia, unspecified: Secondary | ICD-10-CM | POA: Insufficient documentation

## 2012-12-01 DIAGNOSIS — M7062 Trochanteric bursitis, left hip: Secondary | ICD-10-CM

## 2012-12-01 DIAGNOSIS — I10 Essential (primary) hypertension: Secondary | ICD-10-CM | POA: Insufficient documentation

## 2012-12-01 MED ORDER — OXYCODONE-ACETAMINOPHEN 5-325 MG PO TABS
2.0000 | ORAL_TABLET | Freq: Once | ORAL | Status: AC
  Administered 2012-12-01: 2 via ORAL
  Filled 2012-12-01: qty 2

## 2012-12-01 MED ORDER — PREDNISONE 20 MG PO TABS: 40.0000 mg | ORAL_TABLET | Freq: Every day | ORAL | Status: DC

## 2012-12-01 NOTE — ED Provider Notes (Signed)
History     CSN: [id number]  Arrival date & time 12/01/12  1035   First MD Initiated Contact with Patient 12/01/12 1049      Chief Complaint  Patient presents with  . Hip Pain    (Consider location/radiation/quality/duration/timing/severity/associated sxs/prior treatment) HPI Comments: This is a 67 year old female, past medical history remarkable for hypertension, hyperlipidemia, and hip pain, who presents to the emergency department with chief complaint of left hip pain which is worsened since Saturday. She states that she's been having hip pain for approximately one year. She's been taking Ultram with some relief. She states the pain radiates to her SI joint. She denies any numbness or tingling in her legs or feet. She states the pain is currently 10 out of 10. States that she has had similar pain, and was treated with prednisone, which relieved her symptoms.  The history is provided by the patient. No language interpreter was used.    Past Medical History  Diagnosis Date  . Hypertension   . Asthma   . Allergic rhinitis   . Hyperlipidemia     Past Surgical History  Procedure Laterality Date  . Appendectomy    . Neck surgery    . Back surgery      Family History  Problem Relation Age of Onset  . Heart disease Mother   . Asthma Sister   . Emphysema Sister   . Hypertension Mother   . Hypertension Sister   . Heart disease Father   . Heart disease Maternal Grandmother     History  Substance Use Topics  . Smoking status: Never Smoker   . Smokeless tobacco: Never Used  . Alcohol Use: No    OB History   Grav Para Term Preterm Abortions TAB SAB Ect Mult Living   2 2 2  0 0 0 0 0 0 0      Review of Systems  All other systems reviewed and are negative.    Allergies  Codeine  Home Medications   Current Outpatient Rx  Name  Route  Sig  Dispense  Refill  . amLODipine (NORVASC) 10 MG tablet   Oral   Take 10 mg by mouth Daily.          . celecoxib  (CELEBREX) 100 MG capsule   Oral   Take 100 mg by mouth 2 (two) times daily.         Marland Kitchen ibuprofen (ADVIL,MOTRIN) 200 MG tablet   Oral   Take 800 mg by mouth every 6 (six) hours as needed for pain (for shoulder and hip pain).         Marland Kitchen omeprazole (PRILOSEC) 20 MG capsule   Oral   Take 1 capsule (20 mg total) by mouth daily.   30 capsule   4   . sertraline (ZOLOFT) 100 MG tablet   Oral   Take 200 mg by mouth Daily.          . solifenacin (VESICARE) 5 MG tablet   Oral   Take 10 mg by mouth daily.         . traMADol (ULTRAM) 50 MG tablet   Oral   Take 50 mg by mouth every 6 (six) hours as needed for pain.           BP 172/79  Pulse 103  Temp(Src) 98.7 F (37.1 C) (Oral)  SpO2 96%  Physical Exam  Nursing note and vitals reviewed. Constitutional: She is oriented to person, place, and time. She appears  well-developed and well-nourished.  HENT:  Head: Normocephalic and atraumatic.  Eyes: Conjunctivae and EOM are normal. Pupils are equal, round, and reactive to light.  Neck: Normal range of motion. Neck supple.  Cardiovascular: Normal rate and regular rhythm.  Exam reveals no gallop and no friction rub.   No murmur heard. Pulmonary/Chest: Effort normal and breath sounds normal. No respiratory distress. She has no wheezes. She has no rales. She exhibits no tenderness.  Abdominal: Soft. Bowel sounds are normal. She exhibits no distension and no mass. There is no tenderness. There is no rebound and no guarding.  Musculoskeletal: Normal range of motion. She exhibits no edema and no tenderness.  Left Hip is tender over the greater trochanter, without redness, or swelling, range of motion is 5/5, strength is 5/5  Neurological: She is alert and oriented to person, place, and time.  Skin: Skin is warm and dry.  Psychiatric: She has a normal mood and affect. Her behavior is normal. Judgment and thought content normal.    ED Course  Procedures (including critical care  time)  No results found for this or any previous visit. Dg Hip Complete Left  12/01/2012  *RADIOLOGY REPORT*  Clinical Data: Pain  LEFT HIP - COMPLETE 2+ VIEW  Comparison: None.  Findings: Negative for fracture.  Joint space is normal.  Negative for AVN.  Soft tissue calcifications adjacent to the greater trochanter consistent with chronic bursitis.  IMPRESSION: No acute abnormality.  Chronic bursitis.   Original Report Authenticated By: Janeece Riggers, M.D.       1. Trochanteric bursitis, left       MDM  67 year old female with hip pain. Based on my physical exam findings I am suspicious of arthritic changes versus trochanteric bursitis. Patient is requesting an MRI and blood cultures. I explained that neither of these tests were indicated at this time. Patient seemed disappointed, but agreeable. I told the patient that we can get an x-ray to evaluate for any effusions or fractures. Will treat the patient with pain medicine in the emergency department. Recommend followup with Dr. Manual Meier.  If plain films are negative, will discharge with a prednisone taper for bursitis.        Roxy Horseman, PA-C 12/01/12 1232

## 2012-12-01 NOTE — ED Notes (Signed)
Pt here with left hip pain that has been worse since Saturday.  Pt has been having some trouble with this left hip for almost a year.  It has not been this severe in past.  Not associated with any trauma

## 2012-12-01 NOTE — ED Notes (Signed)
Returned from xray

## 2012-12-01 NOTE — ED Notes (Signed)
C/O left hip pain radiating to sacral area. Has had a similar pain before and saw Dr. Bryson Ha. This pain started on Saturday. No known injury.

## 2012-12-01 NOTE — ED Provider Notes (Signed)
Medical screening examination/treatment/procedure(s) were performed by non-physician practitioner and as supervising physician I was immediately available for consultation/collaboration.   David H Yao, MD 12/01/12 1550 

## 2012-12-11 ENCOUNTER — Ambulatory Visit (INDEPENDENT_AMBULATORY_CARE_PROVIDER_SITE_OTHER): Payer: Medicare Other | Admitting: Critical Care Medicine

## 2012-12-11 ENCOUNTER — Encounter: Payer: Self-pay | Admitting: Critical Care Medicine

## 2012-12-11 VITALS — BP 138/66 | HR 94 | Temp 98.2°F | Ht 63.0 in | Wt 198.0 lb

## 2012-12-11 DIAGNOSIS — R05 Cough: Secondary | ICD-10-CM

## 2012-12-11 NOTE — Progress Notes (Signed)
Subjective:    Patient ID: Dawn Salinas, female    DOB: April 03, 1946, 67 y.o.   MRN: [id number]  HPI 12/11/2012 Cough is much better.   Pt denies any significant sore throat, nasal congestion or excess secretions, fever, chills, sweats, unintended weight loss, pleurtic or exertional chest pain, orthopnea PND, or leg swelling Pt denies any increase in rescue therapy over baseline, denies waking up needing it or having any early am or nocturnal exacerbations of coughing/wheezing/or dyspnea. Pt also denies any obvious fluctuation in symptoms with  weather or environmental change or other alleviating or aggravating factors      Past Medical History  Diagnosis Date  . Hypertension   . Asthma   . Allergic rhinitis   . Hyperlipidemia      Family History  Problem Relation Age of Onset  . Heart disease Mother   . Asthma Sister   . Emphysema Sister   . Hypertension Mother   . Hypertension Sister   . Heart disease Father   . Heart disease Maternal Grandmother      History   Social History  . Marital Status: Married    Spouse Name: N/A    Number of Children: N/A  . Years of Education: N/A   Occupational History  . Not on file.   Social History Main Topics  . Smoking status: Never Smoker   . Smokeless tobacco: Never Used  . Alcohol Use: No  . Drug Use: No  . Sexually Active:    Other Topics Concern  . Not on file   Social History Narrative  . No narrative on file     Allergies  Allergen Reactions  . Codeine     REACTION: rash, itching, hives     Outpatient Prescriptions Prior to Visit  Medication Sig Dispense Refill  . amLODipine (NORVASC) 10 MG tablet Take 10 mg by mouth Daily.       . celecoxib (CELEBREX) 100 MG capsule Take 100 mg by mouth 2 (two) times daily.      Marland Kitchen ibuprofen (ADVIL,MOTRIN) 200 MG tablet Take 800 mg by mouth every 6 (six) hours as needed for pain (for shoulder and hip pain).      Marland Kitchen omeprazole (PRILOSEC) 20 MG capsule Take 1 capsule (20 mg  total) by mouth daily.  30 capsule  4  . sertraline (ZOLOFT) 100 MG tablet Take 200 mg by mouth Daily.       . solifenacin (VESICARE) 5 MG tablet Take 10 mg by mouth daily.      . traMADol (ULTRAM) 50 MG tablet Take 50 mg by mouth every 6 (six) hours as needed for pain.      . predniSONE (DELTASONE) 20 MG tablet Take 2 tablets (40 mg total) by mouth daily.  10 tablet  0   No facility-administered medications prior to visit.       Review of Systems  Constitutional: Negative for diaphoresis, activity change, appetite change, fatigue and unexpected weight change.  HENT: Positive for voice change. Negative for hearing loss, nosebleeds, congestion, facial swelling, sneezing, mouth sores, trouble swallowing, neck pain, neck stiffness, dental problem, sinus pressure, tinnitus and ear discharge.   Eyes: Negative for photophobia, discharge, itching and visual disturbance.  Respiratory: Negative for apnea, choking, chest tightness and stridor.   Cardiovascular: Negative for palpitations and leg swelling.  Gastrointestinal: Negative for nausea, vomiting, abdominal pain, constipation, blood in stool and abdominal distention.  Genitourinary: Positive for urgency. Negative for dysuria, frequency, hematuria, flank pain, decreased  urine volume and difficulty urinating.  Musculoskeletal: Positive for back pain, joint swelling and arthralgias. Negative for gait problem.  Skin: Negative for color change and pallor.  Neurological: Positive for weakness. Negative for dizziness, tremors, seizures, syncope, speech difficulty, light-headedness and numbness.  Hematological: Negative for adenopathy. Does not bruise/bleed easily.  Psychiatric/Behavioral: Negative for confusion, sleep disturbance and agitation. The patient is not nervous/anxious.        Objective:   Physical Exam   Filed Vitals:   12/11/12 1445  BP: 138/66  Pulse: 94  Temp: 98.2 F (36.8 C)  TempSrc: Oral  Height: 5\' 3"  (1.6 m)  Weight:  198 lb (89.812 kg)  SpO2: 95%    Gen: Pleasant, well-nourished, in no distress,  normal affect  ENT: No lesions,  mouth clear,  oropharynx clear, no postnasal drip  Neck: No JVD, no TMG, no carotid bruits  Lungs: No use of accessory muscles, no dullness to percussion,  Less prominent pseudo-wheeze   Cardiovascular: RRR, heart sounds normal, no murmur or gallops, no peripheral edema  Abdomen: soft and NT, no HSM,  BS normal  Musculoskeletal: No deformities, no cyanosis or clubbing  Neuro: alert, non focal  Skin: Warm, no lesions or rashes  No results found. 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 amLODipine (NORVASC) 10 MG tablet Take 10 mg by mouth Daily.       . celecoxib (CELEBREX) 100 MG capsule Take 100 mg by mouth 2 (two) times daily.      Marland Kitchen ibuprofen (ADVIL,MOTRIN) 200 MG tablet Take 800 mg by mouth every 6 (six) hours as needed for pain (for shoulder and hip pain).      Marland Kitchen omeprazole (PRILOSEC) 20 MG capsule Take 1 capsule (20 mg total) by mouth daily.  30 capsule  4  . sertraline (ZOLOFT) 100 MG tablet Take 200 mg by mouth Daily.       . solifenacin (VESICARE) 5 MG tablet Take 10 mg by mouth daily.      . traMADol (ULTRAM) 50 MG tablet Take 50 mg by mouth every 6 (six) hours as needed for pain.      . [DISCONTINUED] predniSONE (DELTASONE) 20 MG tablet Take 2 tablets (40 mg total) by mouth daily.  10 tablet  0   No facility-administered encounter medications on file as of 12/11/2012.

## 2012-12-11 NOTE — Patient Instructions (Addendum)
Finish current bottle of omeprazole then refill as needed Continue reflux diet Return as needed

## 2012-12-12 NOTE — Assessment & Plan Note (Signed)
Cyclical cough on the basis of chronic reflux disease stable at this time in improved Plan Finish current bottle of omeprazole Use Tessalon Perles as needed Continue to follow reflux diet Return to pulmonary as needed

## 2012-12-18 ENCOUNTER — Encounter: Payer: Self-pay | Admitting: Nurse Practitioner

## 2012-12-18 ENCOUNTER — Ambulatory Visit (INDEPENDENT_AMBULATORY_CARE_PROVIDER_SITE_OTHER): Payer: Medicare Other | Admitting: Nurse Practitioner

## 2012-12-18 VITALS — BP 102/70 | Resp 18 | Ht 62.0 in | Wt 201.0 lb

## 2012-12-18 DIAGNOSIS — N952 Postmenopausal atrophic vaginitis: Secondary | ICD-10-CM

## 2012-12-18 DIAGNOSIS — N393 Stress incontinence (female) (male): Secondary | ICD-10-CM

## 2012-12-18 DIAGNOSIS — Z01419 Encounter for gynecological examination (general) (routine) without abnormal findings: Secondary | ICD-10-CM

## 2012-12-18 MED ORDER — ESTRADIOL 2 MG VA RING: 2.0000 mg | VAGINAL_RING | VAGINAL | Status: DC

## 2012-12-18 NOTE — Progress Notes (Signed)
67 y.o. MarriedCaucasian female   G2P2000 here for annual exam.  Most irritating problem is with SUI.  will be seeing  Dr. McDiarmid tomorrow.  She states with any cough, standing or sitting any length of time.  That affects SA.  A lot of  Vaginal dryness.  Has tried Estrace Cream that was very messy - so discontinued.  Estring used in the past which was really helpful but cost was an issue  Not using OTC lubrication. On Myrbetriq 50 mg.  She would like to consider a pesssary as discussed last year.  Will talk with [facility] tomorrow. No LMP recorded. Patient has had a hysterectomy.          Sexually active: yes  The current method of family planning is none.    Exercising: yes  Home exercise routine includes occasional walking. Last mammogram:  12/06/2011 Last colonoscopy: 03/2008 - diverticulitis Last Bone Density: 05/23/2004      Health Maintenance  Topic Date Due  . Influenza Vaccine  06/02/1946  . Tetanus/tdap  04/24/1966  . Mammogram  04/24/1997  . Colonoscopy  04/24/1997  . Zostavax  04/25/2007  . Pneumococcal Polysaccharide Vaccine Age 76 And Over  04/24/2012    Family History  Problem Relation Age of Onset  . Heart disease Mother   . Asthma Sister   . Emphysema Sister     COPD  . Hypertension Mother   . Hypertension Sister   . Heart disease Father   . Heart disease Maternal Grandmother     Patient Active Problem List  Diagnosis  . HYPERCHOLESTEROLEMIA  . DEPRESSION  . HYPERTENSION  . 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 DIVERTICULOSIS, COLON  . FATIGUE  . NAUSEA AND VOMITING  . ABDOMINAL PAIN  . ABDOMINAL PAIN, GENERALIZED  . PRE-DIABETES  . FIBROMYALGIA  . ABNORMAL ELECTROCARDIOGRAM  . 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 Urge and stress incontinence  . Situational anxiety    Past Medical History  Diagnosis Date  . Hypertension   . Allergic rhinitis   . Hyperlipidemia   . GERD (gastroesophageal reflux  disease) 10/31/2012    earlier Dx. with Asthma, but it was due to GERD by Dr. Shan Levans  . Hip pain, acute 11/2012    Bursitis, got cortisone injection at Dr. Darrelyn Hillock    Past Surgical History  Procedure Laterality Date  . Appendectomy    . Neck surgery  2005    fusion C 4-5  . Total knee arthroplasty Right 1996  . Hysteroscopy  1996    TAH secondary to Endometriosisand ovarian cyctectomy    Allergies: Codeine  Current Outpatient Prescriptions  Medication Sig Dispense Refill  . amLODipine (NORVASC) 10 MG tablet Take 10 mg by mouth Daily.       . celecoxib (CELEBREX) 100 MG capsule Take 100 mg by mouth 2 (two) times daily.      Marland Kitchen omeprazole (PRILOSEC) 20 MG capsule Take 1 capsule (20 mg total) by mouth daily.  30 capsule  4  . sertraline (ZOLOFT) 100 MG tablet Take 200 mg by mouth Daily.       . solifenacin (VESICARE) 5 MG tablet Take 10 mg by mouth daily.      . traMADol (ULTRAM) 50 MG tablet Take 50 mg by mouth every 6 (six) hours as needed for pain.      Marland Kitchen ibuprofen (ADVIL,MOTRIN) 200 MG tablet Take 800 mg by mouth every 6 (six) hours as needed  for pain (for shoulder and hip pain).       No current facility-administered medications for this visit.    ROS: Pertinent items are noted in HPI.  Exam:    BP 102/70  Resp 18  Ht 5\' 2"  (1.575 m)  Wt 201 lb (91.173 kg)  BMI 36.75 kg/m2   Wt Readings from Last 3 Encounters:  12/18/12 201 lb (91.173 kg)  12/11/12 198 lb (89.812 kg)  10/23/12 203 lb (92.08 kg)     Ht Readings from Last 3 Encounters:  12/18/12 5\' 2"  (1.575 m)  12/11/12 5\' 3"  (1.6 m)  10/23/12 5\' 3"  (1.6 m)    General appearance: alert, cooperative and appears stated age Head: Normocephalic, without obvious abnormality, atraumatic Neck: no adenopathy, supple, symmetrical, trachea midline and thyroid not enlarged, symmetric, no tenderness/mass/nodules Lungs: clear to auscultation bilaterally Breasts: Inspection negative, No nipple retraction or dimpling, No  nipple discharge or bleeding, No axillary or supraclavicular adenopathy, Normal to palpation without dominant masses Heart: regular rate and rhythm Abdomen: soft, non-tender; bowel sounds normal; no masses,  no organomegaly Extremities: extremities normal, atraumatic, no cyanosis or edema Skin: Skin color, texture, turgor normal. No rashes or lesions Lymph nodes: Cervical, supraclavicular, and axillary nodes normal. No abnormal inguinal nodes palpated Neurologic: Grossly normal   Pelvic: External genitalia:  no lesions              Urethra:  normal appearing urethra with no masses, tenderness or lesions              Bartholins and Skenes: normal                 Vagina: atrophic appearing vagina with pale color and discharge, no lesions              Pap taken: no        Bimanual Exam:                                        Adnexa: normal adnexa in size, nontender and no masses                                      Rectovaginal: Confirms                                      Anus:  normal sphincter tone, no lesions  A: GYN well woman History of stress incontinence - followed by [facility] Dr. McDiarmid     P:     mammogram due now pt to schedule.  return annually or prn    Pt.may consider a pessary for use if other urological procedures are finished. She will call back if wants to discuss further  Sample and coupon of Estring Vaginal ring is given, I would like for her to use the ring at least a month before trying to place a pessary if she decides to go that way.   An After Visit Summary was printed and given to the patient.

## 2012-12-18 NOTE — Patient Instructions (Signed)
Pt. Is given Estring 2 mg. Vaginal Ring and coupon. She will check insurance coverage to see if affordable.   She will also consider pessary fitting after being on the ring X 1 month if she feels that [facility] eval tomorrow is finished with their treatments. If decides on Pessary will need to come in for a fitting on Patty's schedule. Next pap due in 1 year. Next Mammogram due: NOW  11/2012 Please schedule Examine your breast Monthly Take a Women's multivitamin Take 1200 mg. of calcium daily - prefer dietary If any concerns in interim to call back

## 2013-01-03 ENCOUNTER — Emergency Department (HOSPITAL_COMMUNITY)
Admission: EM | Admit: 2013-01-03 | Discharge: 2013-01-03 | Disposition: A | Discharge status: Home / Self Care | Payer: Medicare Other | Attending: Emergency Medicine | Admitting: Emergency Medicine

## 2013-01-03 ENCOUNTER — Emergency Department (HOSPITAL_COMMUNITY): Payer: Medicare Other

## 2013-01-03 DIAGNOSIS — K219 Gastro-esophageal reflux disease without esophagitis: Secondary | ICD-10-CM | POA: Insufficient documentation

## 2013-01-03 DIAGNOSIS — Z8739 Personal history of other diseases of the musculoskeletal system and connective tissue: Secondary | ICD-10-CM | POA: Insufficient documentation

## 2013-01-03 DIAGNOSIS — Z79899 Other long term (current) drug therapy: Secondary | ICD-10-CM | POA: Insufficient documentation

## 2013-01-03 DIAGNOSIS — R55 Syncope and collapse: Secondary | ICD-10-CM | POA: Insufficient documentation

## 2013-01-03 DIAGNOSIS — Z862 Personal history of diseases of the blood and blood-forming organs and certain disorders involving the immune mechanism: Secondary | ICD-10-CM | POA: Insufficient documentation

## 2013-01-03 DIAGNOSIS — Z8639 Personal history of other endocrine, nutritional and metabolic disease: Secondary | ICD-10-CM | POA: Insufficient documentation

## 2013-01-03 DIAGNOSIS — I10 Essential (primary) hypertension: Secondary | ICD-10-CM | POA: Insufficient documentation

## 2013-01-03 DIAGNOSIS — R42 Dizziness and giddiness: Secondary | ICD-10-CM | POA: Insufficient documentation

## 2013-01-03 DIAGNOSIS — R0789 Other chest pain: Secondary | ICD-10-CM

## 2013-01-03 DIAGNOSIS — J45901 Unspecified asthma with (acute) exacerbation: Secondary | ICD-10-CM | POA: Insufficient documentation

## 2013-01-03 DIAGNOSIS — R5383 Other fatigue: Secondary | ICD-10-CM | POA: Insufficient documentation

## 2013-01-03 DIAGNOSIS — Z791 Long term (current) use of non-steroidal anti-inflammatories (NSAID): Secondary | ICD-10-CM | POA: Insufficient documentation

## 2013-01-03 DIAGNOSIS — R5381 Other malaise: Secondary | ICD-10-CM | POA: Insufficient documentation

## 2013-01-03 DIAGNOSIS — R112 Nausea with vomiting, unspecified: Secondary | ICD-10-CM | POA: Insufficient documentation

## 2013-01-03 LAB — COMPREHENSIVE METABOLIC PANEL
Albumin: 4 g/dL (ref 3.5–5.2)
BUN: 14 mg/dL (ref 6–23)
Calcium: 9.4 mg/dL (ref 8.4–10.5)
GFR calc Af Amer: 86 mL/min — ABNORMAL LOW (ref >90)
Glucose, Bld: 119 mg/dL — ABNORMAL HIGH (ref 70–99)
Potassium: 3.6 mEq/L (ref 3.5–5.1)
Total Protein: 7.1 g/dL (ref 6.0–8.3)

## 2013-01-03 LAB — POCT I-STAT TROPONIN I
Troponin i, poc: 0.02 ng/mL (ref 0.00–0.08)
Troponin i, poc: 0.01 ng/mL (ref 0.00–0.08)

## 2013-01-03 LAB — CBC WITH DIFFERENTIAL/PLATELET
Band Neutrophils: 0 % (ref 0–10)
Eosinophils Relative: 1 % (ref 0–5)
HCT: 37.1 % (ref 36.0–46.0)
Lymphocytes Relative: 27 % (ref 12–46)
MCH: 28.3 pg (ref 26.0–34.0)
MCV: 83.4 fL (ref 78.0–100.0)
Monocytes Absolute: 0.7 10*3/uL (ref 0.1–1.0)
Monocytes Relative: 8 % (ref 3–12)
Platelets: 271 10*3/uL (ref 150–400)
RBC: 4.45 MIL/uL (ref 3.87–5.11)
RDW: 14.4 % (ref 11.5–15.5)
WBC: 8.6 10*3/uL (ref 4.0–10.5)

## 2013-01-03 LAB — LIPASE, BLOOD: Lipase: 24 U/L (ref 11–59)

## 2013-01-03 LAB — D-DIMER, QUANTITATIVE: D-Dimer, Quant: 0.57 ug/mL-FEU — ABNORMAL HIGH (ref 0.00–0.48)

## 2013-01-03 MED ORDER — ONDANSETRON HCL 4 MG/2ML IJ SOLN
4.0000 mg | Freq: Once | INTRAMUSCULAR | Status: AC
  Administered 2013-01-03: 4 mg via INTRAVENOUS
  Filled 2013-01-03: qty 2

## 2013-01-03 MED ORDER — IOHEXOL 350 MG/ML SOLN
100.0000 mL | Freq: Once | INTRAVENOUS | Status: AC | PRN
  Administered 2013-01-03: 100 mL via INTRAVENOUS

## 2013-01-03 MED ORDER — SODIUM CHLORIDE 0.9 % IV BOLUS (SEPSIS)
1000.0000 mL | Freq: Once | INTRAVENOUS | Status: AC
  Administered 2013-01-03: 1000 mL via INTRAVENOUS

## 2013-01-03 MED ORDER — ASPIRIN 81 MG PO CHEW
324.0000 mg | CHEWABLE_TABLET | Freq: Once | ORAL | Status: AC
  Administered 2013-01-03: 324 mg via ORAL
  Filled 2013-01-03: qty 4

## 2013-01-03 NOTE — ED Notes (Signed)
Per EMS: Pt from home with reports of sudden onset of dizziness, with 1 syncopal episode and 1 episode of vomiting. Witnessed fall; No injuries noted. AO x 4.  Pt reports dizziness for last couple of days. 12 lead unremarkable. 111/62. 96 SR. 129 CBG.

## 2013-01-03 NOTE — ED Provider Notes (Signed)
History     CSN: [id number]  Arrival date & time 01/03/13  1507   First MD Initiated Contact with Patient 01/03/13 1516      Chief Complaint  Patient presents with  . Loss of Consciousness  . Nausea    (Consider location/radiation/quality/duration/timing/severity/associated sxs/prior treatment) The history is provided by the patient and medical records. No language interpreter was used.    Dawn Salinas is a 67 y.o. female  with a hx of HTN, HLD, GERD presents to the Emergency Department complaining of acute onset, resolved syncopal episode onset 1.5 hours ago. Associated symptoms include of chest heaviness, SOB, weakness, nausea, vomiting x1 after the episode, lightheadedness, generalized weakness.  Pt has an "estring" that releases estrogen.  Nothing makes it better and moving around makes it worse.  Pt denies fever, chills, neck pain, leg swelling, headache, abdominal pain, dysuria, hematuria, . Pt had 2 heart caths before 2005 because pt was having trouble with CP and increased BP and both were clean.  Pt used Dr Allyson Sabal with [facility] for the cath, but has not seen him regularly since the caths.  Pt states she has been taking her medications as prescribed.        Past Medical History  Diagnosis Date  . Hypertension   . Allergic rhinitis   . Hyperlipidemia   . GERD (gastroesophageal reflux disease) 10/31/2012    earlier Dx. with Asthma, but it was due to GERD by Dr. Shan Levans  . Hip pain, acute 11/2012    Bursitis, got cortisone injection at Dr. Darrelyn Hillock    Past Surgical History  Procedure Laterality Date  . Appendectomy    . Neck surgery  2005    fusion C 4-5  . Total knee arthroplasty Right 1996  . Hysteroscopy  1996    TAH secondary to Endometriosisand ovarian cyctectomy    Family History  Problem Relation Age of Onset  . Heart disease Mother   . Asthma Sister   . Emphysema Sister     COPD  . Hypertension Mother   . Hypertension Sister   . Heart disease  Father   . Heart disease Maternal Grandmother     History  Substance Use Topics  . Smoking status: Never Smoker   . Smokeless tobacco: Never Used  . Alcohol Use: No    OB History   Grav Para Term Preterm Abortions TAB SAB Ect Mult Living   2 2 2  0 0 0 0 0 0 0      Review of Systems  Constitutional: Positive for fatigue. Negative for fever, diaphoresis, appetite change and unexpected weight change.  HENT: Negative for mouth sores and neck stiffness.   Eyes: Negative for visual disturbance.  Respiratory: Positive for chest tightness. Negative for cough, shortness of breath and wheezing.   Cardiovascular: Positive for chest pain.  Gastrointestinal: Negative for nausea, vomiting, abdominal pain, diarrhea and constipation.  Endocrine: Negative for polydipsia, polyphagia and polyuria.  Genitourinary: Negative for dysuria, urgency, frequency and hematuria.  Musculoskeletal: Negative for back pain.  Skin: Negative for rash.  Allergic/Immunologic: Negative for immunocompromised state.  Neurological: Positive for syncope and light-headedness. Negative for headaches.  Hematological: Does not bruise/bleed easily.  Psychiatric/Behavioral: Negative for sleep disturbance. The patient is not nervous/anxious.     Allergies  Codeine  Home Medications   Current Outpatient Rx  Name  Route  Sig  Dispense  Refill  . amLODipine (NORVASC) 10 MG tablet   Oral   Take 10 mg  by mouth Daily.          . celecoxib (CELEBREX) 100 MG capsule   Oral   Take 100 mg by mouth 2 (two) times daily.         Marland Kitchen ibuprofen (ADVIL,MOTRIN) 200 MG tablet   Oral   Take 400 mg by mouth every 6 (six) hours as needed for pain (for hip pain.).          Marland Kitchen sertraline (ZOLOFT) 100 MG tablet   Oral   Take 200 mg by mouth Daily.          Marland Kitchen estradiol (ESTRING) 2 MG vaginal ring   Vaginal   Place 2 mg vaginally every 3 (three) months. Insert a new ring into vagina every 3 months   1 each   3   .  omeprazole (PRILOSEC) 20 MG capsule   Oral   Take 1 capsule (20 mg total) by mouth daily.   30 capsule   4   . traMADol (ULTRAM) 50 MG tablet   Oral   Take 50 mg by mouth every 6 (six) hours as needed for pain.           BP 145/67  Pulse 93  Resp 16  SpO2 96%  Physical Exam  Nursing note and vitals reviewed. Constitutional: She is oriented to person, place, and time. She appears well-developed and well-nourished. No distress.  HENT:  Head: Normocephalic and atraumatic.  Right Ear: External ear normal.  Left Ear: External ear normal.  Nose: Nose normal. No mucosal edema or rhinorrhea.  Mouth/Throat: Uvula is midline, oropharynx is clear and moist and mucous membranes are normal. Mucous membranes are not dry and not cyanotic. No edematous. No oropharyngeal exudate, posterior oropharyngeal edema, posterior oropharyngeal erythema or tonsillar abscesses.  Cerumen in ear canals bilaterally No contusion or hematoma noted to the head  Eyes: Conjunctivae and EOM are normal. Pupils are equal, round, and reactive to light. No scleral icterus.  Neck: Normal range of motion and full passive range of motion without pain. Neck supple. No spinous process tenderness and no muscular tenderness present. Normal range of motion present.  Cardiovascular: Normal rate, regular rhythm, normal heart sounds and intact distal pulses.  Exam reveals no gallop and no friction rub.   No murmur heard. Pulmonary/Chest: Effort normal and breath sounds normal. No respiratory distress. She has no wheezes. She has no rales. She exhibits no tenderness.  Abdominal: Soft. Bowel sounds are normal. She exhibits no distension and no mass. There is no tenderness. There is no rebound and no guarding.  Musculoskeletal: Normal range of motion. She exhibits no edema and no tenderness.  No peripheral edema No calf tenderness, palpable cord and negative Homan's sign  Lymphadenopathy:    She has no cervical adenopathy.   Neurological: She is alert and oriented to person, place, and time. She has normal reflexes. No cranial nerve deficit. She exhibits normal muscle tone. Coordination normal.  Speech is clear and goal oriented, follows commands Major Cranial nerves without deficit, no facial droop Normal strength in upper and lower extremities bilaterally including dorsiflexion and plantar flexion, strong and equal grip strength Sensation normal to light and sharp touch Moves extremities without ataxia, coordination intact Normal finger to nose and rapid alternating movements Neg modified romberg, no pronator drift Normal gait and balance  Skin: Skin is warm and dry. No rash noted. She is not diaphoretic. No erythema.  Psychiatric: She has a normal mood and affect. Her behavior is normal.  ED Course  Procedures (including critical care time)  Labs Reviewed  COMPREHENSIVE METABOLIC PANEL - Abnormal; Notable for the following:    Glucose, Bld 119 (*)    GFR calc non Af Amer 75 (*)    GFR calc Af Amer 86 (*)    All other components within normal limits  D-DIMER, QUANTITATIVE - Abnormal; Notable for the following:    D-Dimer, Quant 0.57 (*)    All other components within normal limits  CBC WITH DIFFERENTIAL  LIPASE, BLOOD  URINALYSIS, ROUTINE W REFLEX MICROSCOPIC  POCT I-STAT TROPONIN I  POCT I-STAT TROPONIN I   Dg Chest 2 View  01/03/2013  *RADIOLOGY REPORT*  Clinical Data:  Chest tightness and syncopal episode.  CHEST - 2 VIEW  Comparison: 10/23/2012  Findings: The heart size and mediastinal contours are within normal limits.  Both lungs are clear.  The visualized skeletal structures are unremarkable.  IMPRESSION: No active disease.   Original Report Authenticated By: Irish Lack, M.D.    Ct Angio Chest Pe W/cm &/or Wo Cm  01/03/2013  *RADIOLOGY REPORT*  Clinical Data: Syncope.  Elevated D-dimer.  CT ANGIOGRAPHY CHEST  Technique:  Multidetector CT imaging of the chest using the standard protocol  during bolus administration of intravenous contrast. Multiplanar reconstructed images including MIPs were obtained and reviewed to evaluate the vascular anatomy.  Contrast: OMNIPAQUE IOHEXOL 350 MG/ML SOLN  Comparison: None  Findings: Lungs/pleura: There is no pleural effusion identified. There is no airspace consolidation identified.  No suspicious pulmonary nodule or mass identified.  Trachea appears patent and is midline.  Heart/Mediastinum: The heart size appears normal.  No pericardial effusion.  No mediastinal or hilar lymph nodes.  There is no abnormal filling defects within the main pulmonary artery or its branches to suggest an acute pulmonary embolus.  Upper abdomen: Imaging through the upper abdomen is unremarkable.  Bones/Musculoskeletal:  Mild spondylosis is noted within the thoracic spine.  IMPRESSION:  1.  No evidence for acute pulmonary embolus.   Original Report Authenticated By: Signa Kell, M.D.     ECG:  Date: 01/03/2013  Rate: 103  Rhythm: sinus tachycardia  QRS Axis: normal  Intervals: normal  ST/T Wave abnormalities: normal  Conduction Disutrbances:none  Narrative Interpretation: nonischemic ECG, unchanged from previous  Old EKG Reviewed: unchanged    1. Syncopal episodes   2. Chest pressure       MDM  Dawn Salinas presents with syncopal episode associated with chest pressure, diaphoresis nausea and vomiting.  Concern for cardiac event. Patient neurologically intact no concern for neurologic event.  Patient is to be discharged with recommendation to follow up with PCP in regards to today's hospital visit. Chest pain is not likely of cardiac or pulmonary etiology d/t presentation, perc negative, VSS, no tracheal deviation, no JVD or new murmur, RRR, breath sounds equal bilaterally, EKG without acute abnormalities, negative troponin, and negative CXR. Pt has been advised start a PPI and return to the ED is CP becomes exertional, associated with diaphoresis or  nausea, radiates to left jaw/arm, worsens or becomes concerning in any way. Pt appears reliable for follow up and is agreeable to discharge.   Case has been discussed with and seen by Dr. Chaney Malling who agrees with the above plan to discharge.        Dahlia Client Lanny Donoso, PA-C 01/04/13 0122

## 2013-01-03 NOTE — ED Notes (Signed)
Pt ambulated in hall with no difficulty. Denies blurred vision, dizziness, lightheadedness.

## 2013-01-04 NOTE — ED Provider Notes (Signed)
Medical screening examination/treatment/procedure(s) were conducted as a shared visit with non-physician practitioner(s) and myself.  I personally evaluated the patient during the encounter  Dawn Salinas is a 67 y.o. female here with an episode of chest pain and syncope. Chest pressure today. Then walked and felt light headed and passed out. D-dimer elevated, CT angio chest nl. Trop neg x 2 and she is low risk for ACS. Not orthostatic. Felt well after fluids. Patient d/c home in good condition.   Richardean Canal, MD 01/04/13 [phone]

## 2013-02-06 ENCOUNTER — Encounter: Payer: Self-pay | Admitting: *Deleted

## 2013-02-06 ENCOUNTER — Other Ambulatory Visit (HOSPITAL_COMMUNITY): Payer: Self-pay | Admitting: Nurse Practitioner

## 2013-02-06 DIAGNOSIS — R109 Unspecified abdominal pain: Secondary | ICD-10-CM

## 2013-02-10 ENCOUNTER — Ambulatory Visit (INDEPENDENT_AMBULATORY_CARE_PROVIDER_SITE_OTHER): Payer: Medicare Other | Admitting: Gastroenterology

## 2013-02-10 ENCOUNTER — Encounter: Payer: Self-pay | Admitting: Gastroenterology

## 2013-02-10 VITALS — BP 130/64 | HR 88 | Ht 62.0 in | Wt 197.2 lb

## 2013-02-10 DIAGNOSIS — R079 Chest pain, unspecified: Secondary | ICD-10-CM

## 2013-02-10 DIAGNOSIS — K219 Gastro-esophageal reflux disease without esophagitis: Secondary | ICD-10-CM

## 2013-02-10 MED ORDER — ESOMEPRAZOLE MAGNESIUM 40 MG PO CPDR: 40.0000 mg | DELAYED_RELEASE_CAPSULE | Freq: Every day | ORAL | Status: DC

## 2013-02-10 NOTE — Patient Instructions (Addendum)
  Keep your HIDA Scan appointment.  Stop Omeprazole and start Nexium 40 mg, one capsule by mouth once daily. New prescription was sent to your pharmacy. _____________________________________________________________________________________________________                                               We are excited to introduce MyChart, a new best-in-class service that provides you online access to important information in your electronic medical record. We want to make it easier for you to view your health information - all in one secure location - when and where you need it. We expect MyChart will enhance the quality of care and service we provide.  When you register for MyChart, you can:    View your test results.    Request appointments and receive appointment reminders via email.    Request medication renewals.    View your medical history, allergies, medications and immunizations.    Communicate with your physician's office through a password-protected site.    Conveniently print information such as your medication lists.  To find out if MyChart is right for you, please talk to a member of our clinical staff today. We will gladly answer your questions about this free health and wellness tool.  If you are age 67 or older and want a member of your family to have access to your record, you must provide written consent by completing a proxy form available at our office. Please speak to our clinical staff about guidelines regarding accounts for patients younger than age 10.  As you activate your MyChart account and need any technical assistance, please call the MyChart technical support line at [phone]) or email your question to [email or web address]. If you email your question(s), please include your name, a return phone number and the best time to reach you.  If you have non-urgent health-related questions, you can send a message to our office through MyChart  at [email or web address]. If you have a medical emergency, call 911.  Thank you for using MyChart as your new health and wellness resource!   MyChart licensed from [employer],  4540-9811. Patents Pending.

## 2013-02-10 NOTE — Progress Notes (Signed)
History of Present Illness:  This is a 67 year old Caucasian female who I find very difficult to discern exactly what has transpired in terms of her clinical health.  I have seen her many years ago and did screening colonoscopy in 2009, endoscopy in 2009.  She is recently been seen by Dr. Luisa Hart right for her chronic cough and is been felt to have acid reflux and she has apparently been on Prilosec 20 mg a day for the last several months.  She previously been on Prilosec but discontinued this medication.  She now is free of cough, but describes an episode of severe chest pain in her mid substernal area radiating to her back on April 5.  She apparently was in the emergency room and had negative cardiac evaluation and angiogram for possible pulmonary emboli.  With this episode she also had nausea and vomiting and diarrhea, all of which have resolved.  He continues with substernal chest pain radiating to her back without true reflux symptoms or dysphagia.  She denies a specific hepatobiliary complaints and has had recent upper abdominal ultrasound exam which apparently was normal at [facility].  Review of her labs shows no specific abnormalities.  She currently is fairly asymptomatic except for chest pain.  She denies current lower gastrointestinal or specific hepatobiliary or systemic complaints.  I have reviewed this patient's present history, medical and surgical past history, allergies and medications.     ROS:   All systems were reviewed and are negative unless otherwise stated in the HPI.    Physical Exam: Blood pressure 130/64, pulse 80 and regular, and weight 197 with a BMI of 36.06. General well developed well nourished patient in no acute distress, appearing their stated age Eyes PERRLA, no icterus, fundoscopic exam per opthamologist Skin no lesions noted Neck supple, no adenopathy, no thyroid enlargement, no tenderness Chest clear to percussion and auscultation Heart no significant  murmurs, gallops or rubs noted Abdomen no hepatosplenomegaly masses or tenderness, BS normal.  Extremities no acute joint lesions, edema, phlebitis or evidence of cellulitis. Neurologic patient oriented x 3, cranial nerves intact, no focal neurologic deficits noted. Psychological mental status normal and normal affect.  Assessment and plan: I suspect this patient does have chronic GERD and may have had an acute viral gastroenteritis in early April with her chest pain, nausea vomiting and diarrhea.  What I can ascertain, she has not had cardiac consultation since her emergency room visit.  She's been scheduled for [facility] HIDA scan on the 29th of this month, I have urged her to continue her PPI therapy' and have changed her to Nexium 40 mg a day, and we will see her after her cardiology consultation and gallbladder radiographic examination.  It is of note that the patient is on Celebrex 100 milligrams twice a day and uses frequent Advil which may contribute to her esophagitis problems.  She probably needs followup endoscopy which has not been done in many years.  He certainly has no typical acid reflux symptoms.  Please copy her primary care physician, referring physician, and pertinent subspecialists.  No diagnosis found.

## 2013-02-26 ENCOUNTER — Encounter (HOSPITAL_COMMUNITY): Payer: Medicare Other

## 2013-05-06 ENCOUNTER — Other Ambulatory Visit: Payer: Self-pay

## 2013-08-06 ENCOUNTER — Other Ambulatory Visit: Payer: Self-pay

## 2013-11-11 ENCOUNTER — Other Ambulatory Visit: Payer: Self-pay | Admitting: Critical Care Medicine

## 2013-12-10 IMAGING — CR DG CHEST 2V
1 series · 1 of 1 positions shown · non-contrast
Comparison: 10/23/2012

CLINICAL DATA: Chest tightness and syncopal episode.

CHEST - 2 VIEW

[w chest lat]
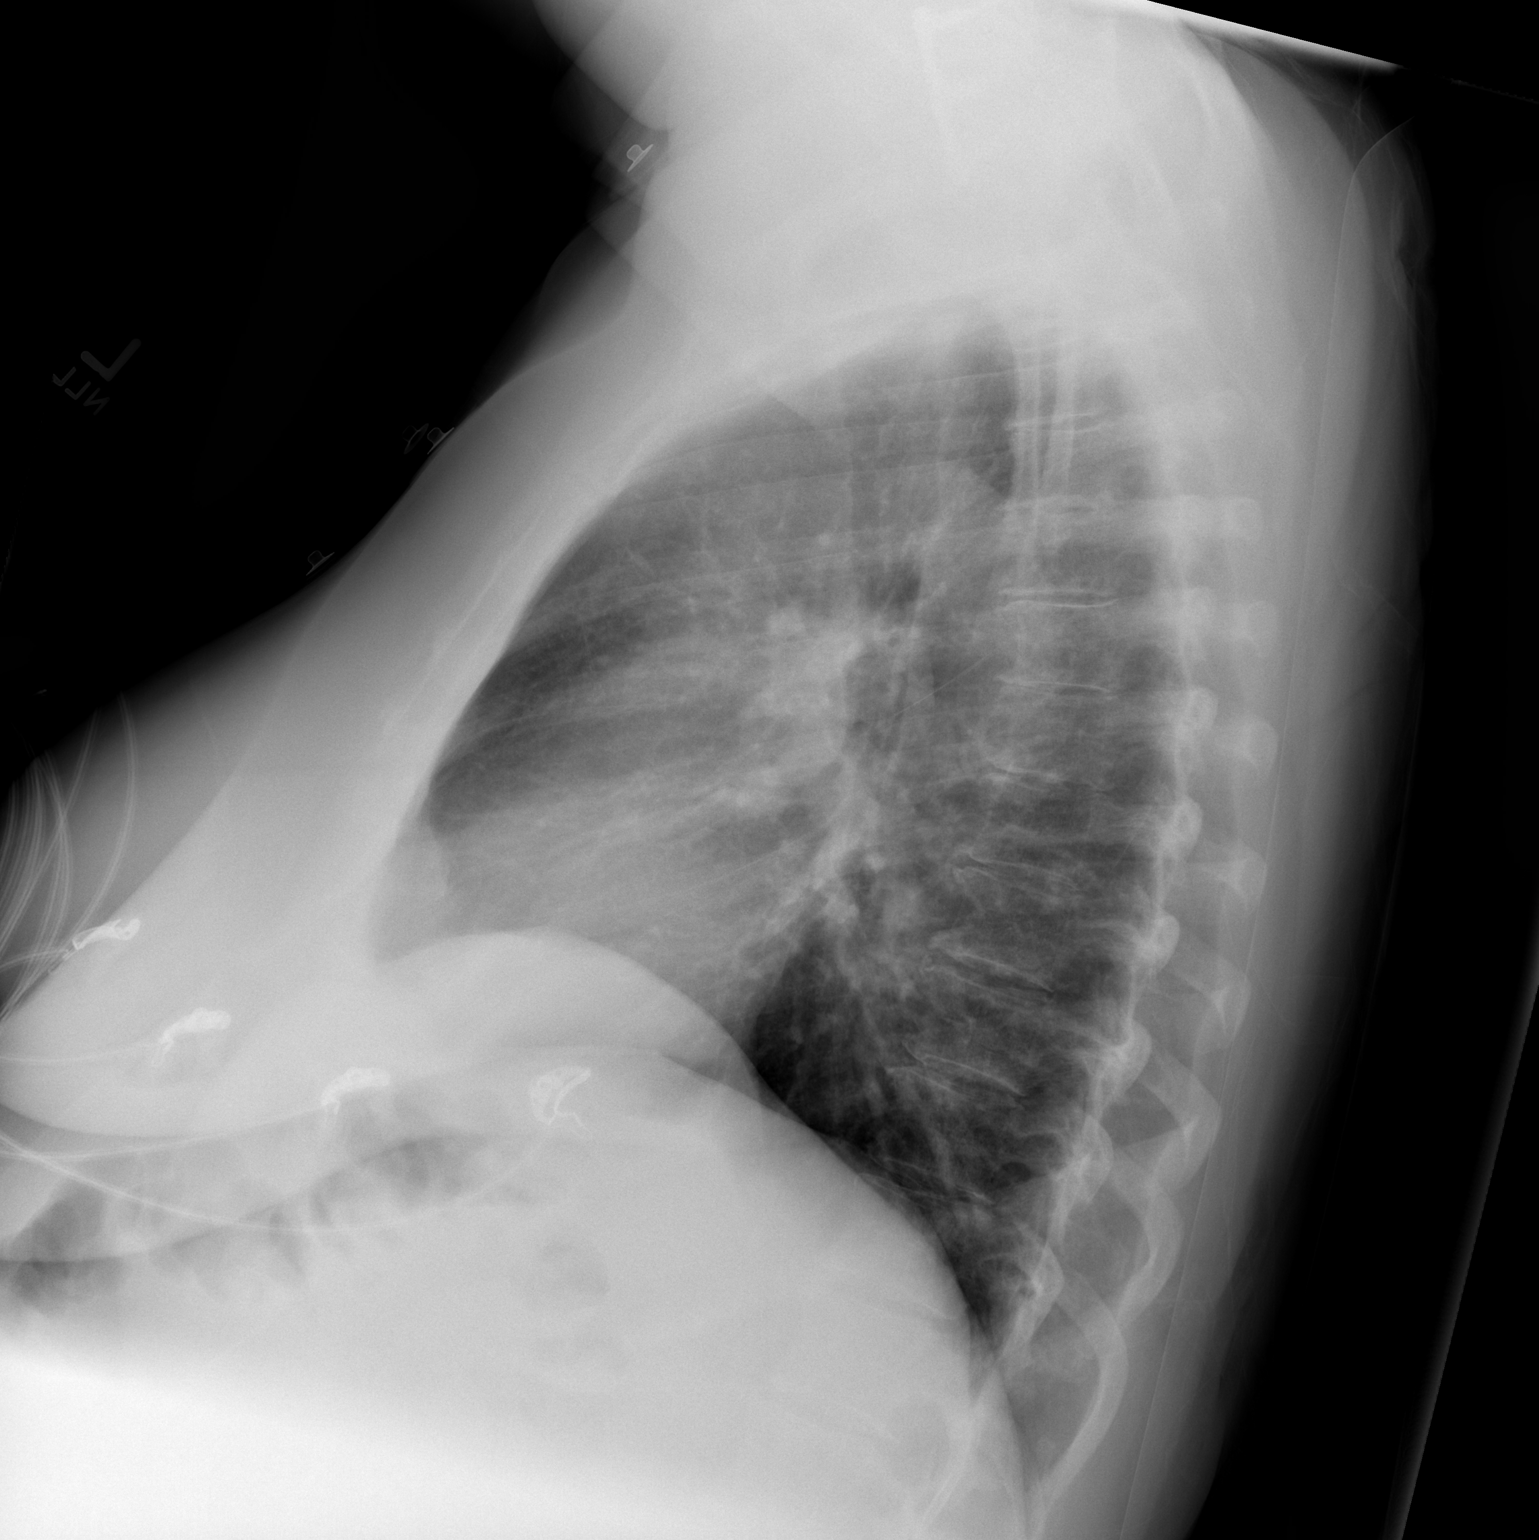

[1 of 1 positions shown; findings below may reference images not displayed]

FINDINGS: The heart size and mediastinal contours are within normal
limits.  Both lungs are clear.  The visualized skeletal structures
are unremarkable.
IMPRESSION: No active disease.

## 2013-12-10 IMAGING — CT CT ANGIO CHEST
2 of 6 series · 19 of 46 positions shown · IV contrast (APPLIED)
Comparison: None

CLINICAL DATA: Syncope.  Elevated D-dimer.

CT ANGIOGRAPHY CHEST
TECHNIQUE: Multidetector CT imaging of the chest using the
standard protocol during bolus administration of intravenous
contrast. Multiplanar reconstructed images including MIPs were
obtained and reviewed to evaluate the vascular anatomy.
Contrast: 100mL OMNIPAQUE IOHEXOL 350 MG/ML SOLN

[Series 6: pulm embolism 1.0 b25f thin · axial · 0.62mm/px · z∈[-30,+234]mm · 16 of 290 slices shown]
[im 13/290  lung]
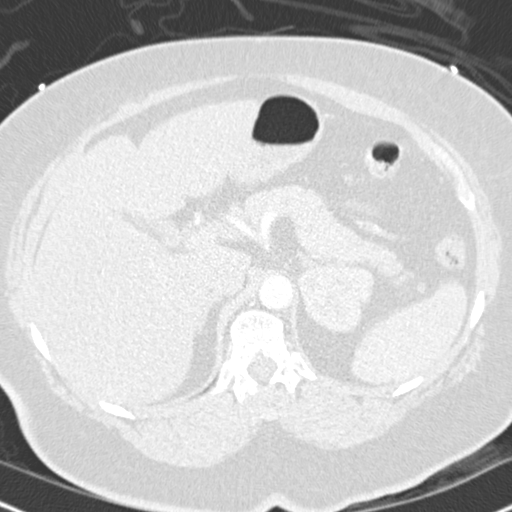
[im 38/290  soft-tissue]
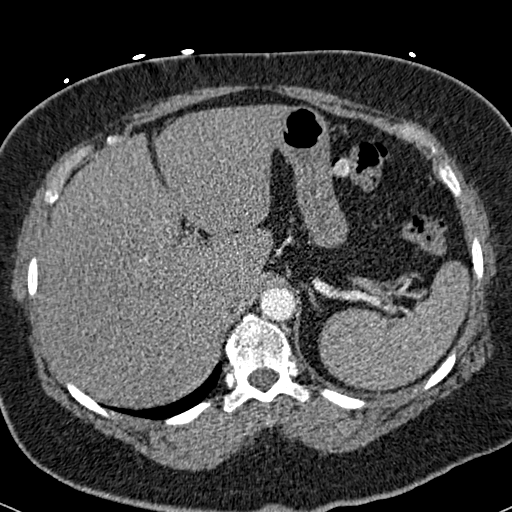
[im 51/290  lung]
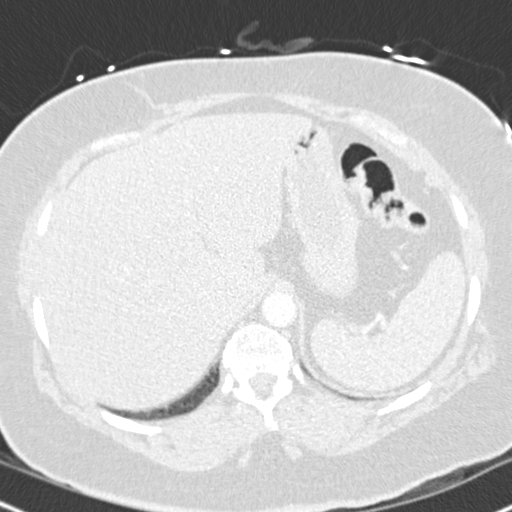
[im 63/290  soft-tissue]
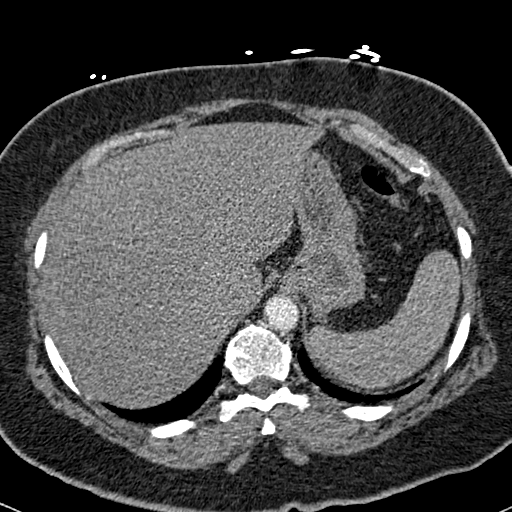
[im 88/290  lung]
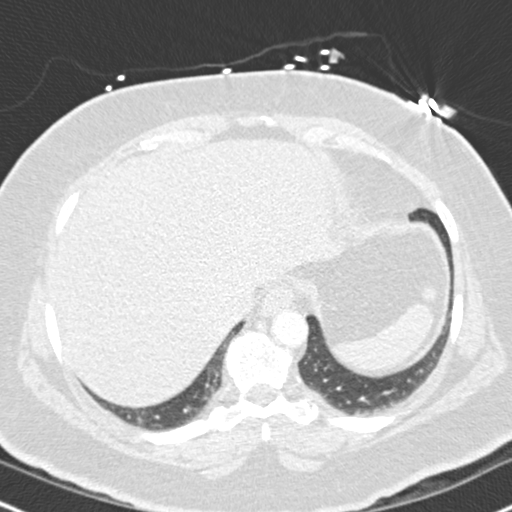
[im 101/290  soft-tissue]
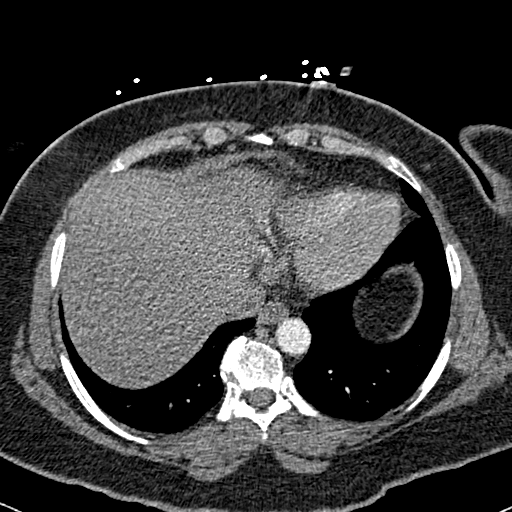
[im 114/290  lung]
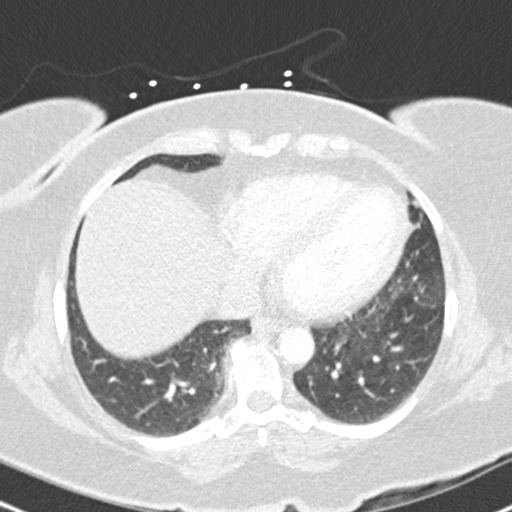
[im 139/290  soft-tissue]
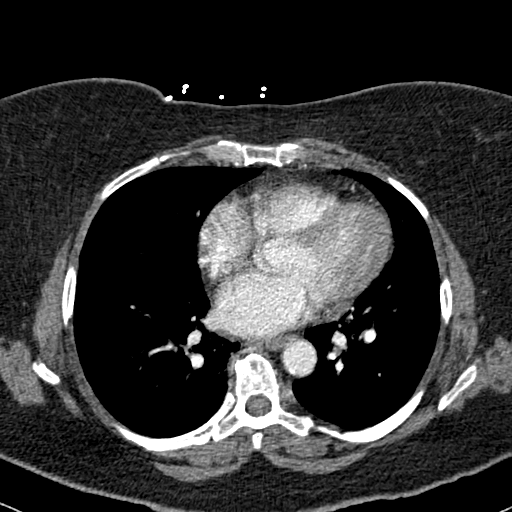
[im 151/290  lung]
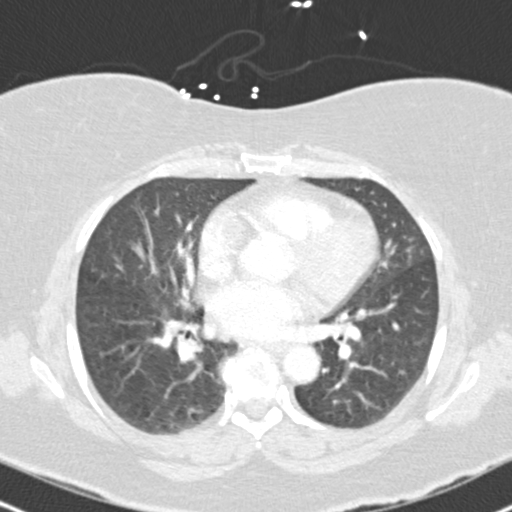
[im 176/290  soft-tissue]
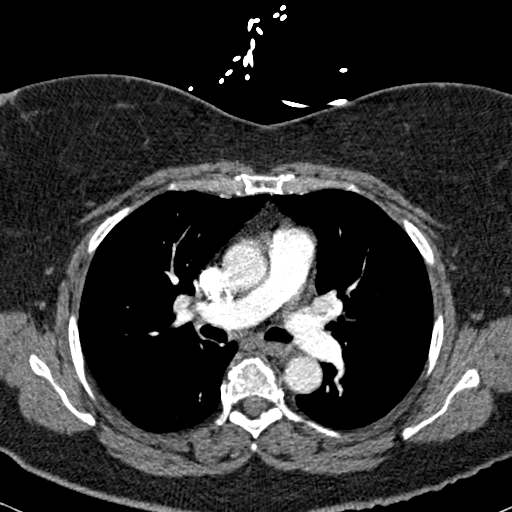
[im 189/290  lung]
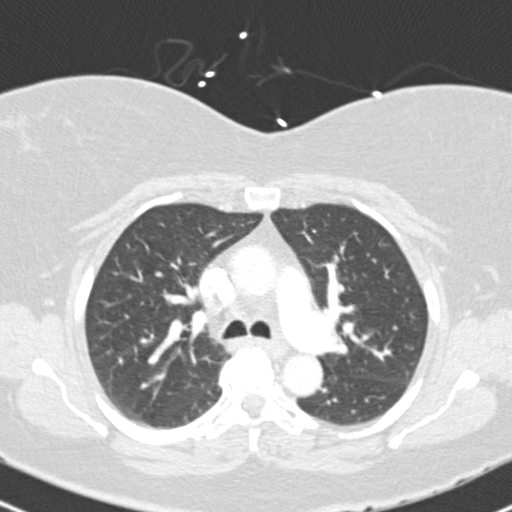
[im 202/290  soft-tissue]
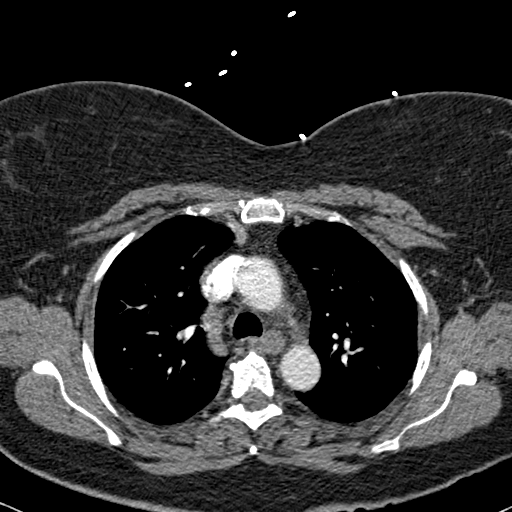
[im 227/290  lung]
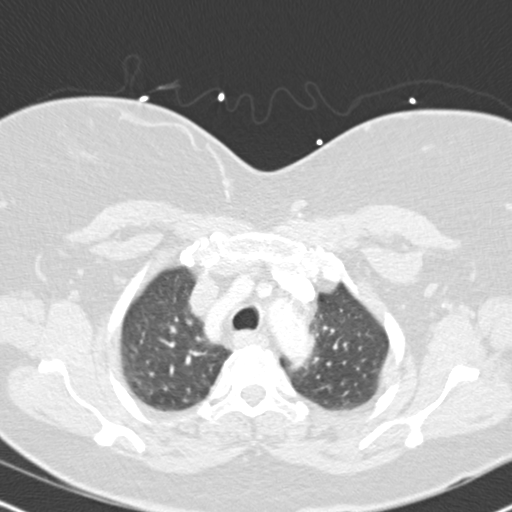
[im 239/290  soft-tissue]
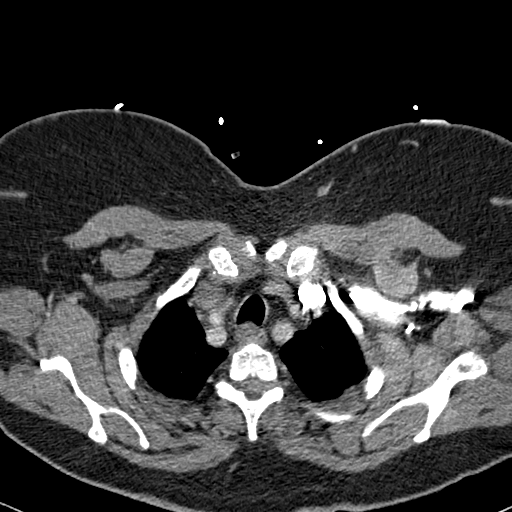
[im 252/290  lung]
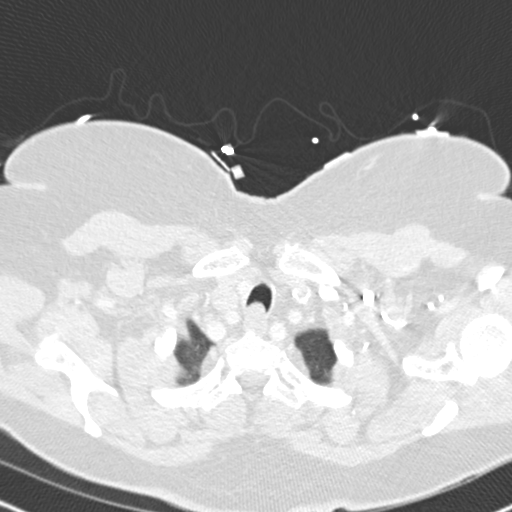
[im 277/290  soft-tissue]
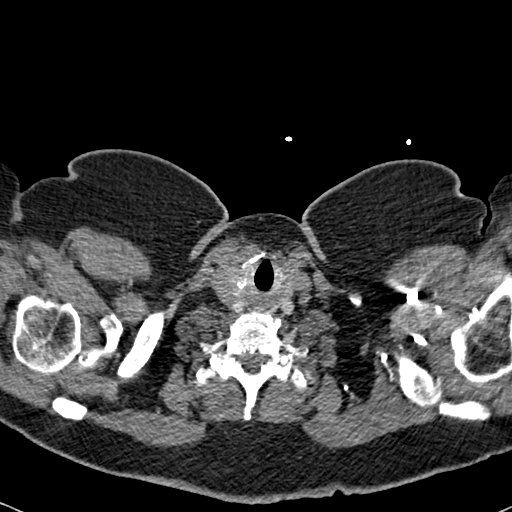

[Series 602: cor · coronal · 0.62mm/px · 3 of 108 slices shown]
[im 27/108  soft-tissue]
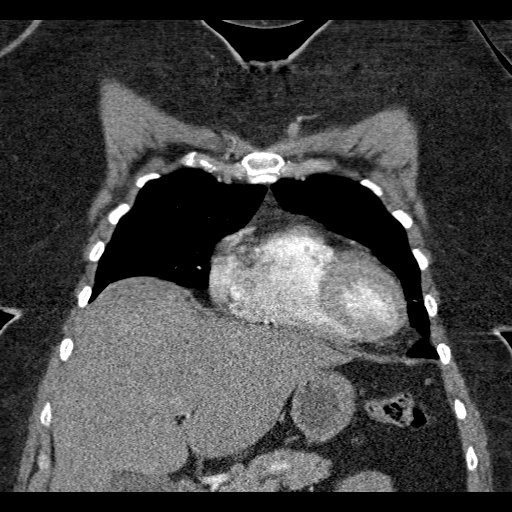
[im 54/108  soft-tissue]
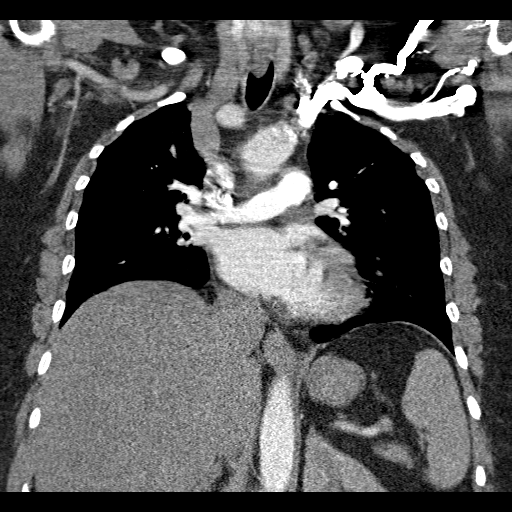
[im 81/108  soft-tissue]
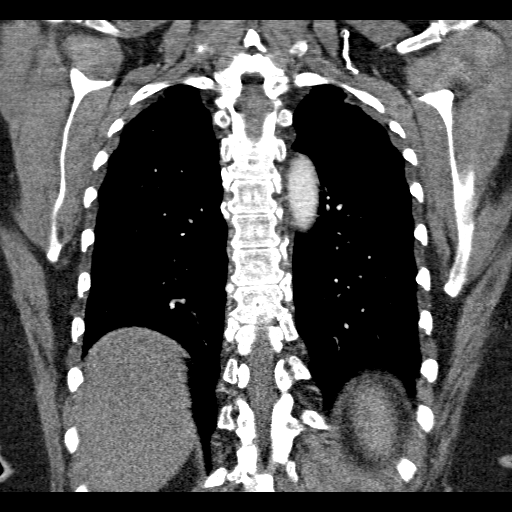

[19 of 46 positions shown; findings below may reference images not displayed]

FINDINGS: Lungs/pleura: There is no pleural effusion identified.
There is no airspace consolidation identified.  No suspicious
pulmonary nodule or mass identified.  Trachea appears patent and is
midline.

Heart/Mediastinum: The heart size appears normal.  No pericardial
effusion.  No mediastinal or hilar lymph nodes.  There is no
abnormal filling defects within the main pulmonary artery or its
branches to suggest an acute pulmonary embolus.

Upper abdomen: Imaging through the upper abdomen is unremarkable.

Bones/Musculoskeletal:  Mild spondylosis is noted within the
thoracic spine.
IMPRESSION: 1.  No evidence for acute pulmonary embolus.

## 2013-12-17 ENCOUNTER — Other Ambulatory Visit: Payer: Self-pay | Admitting: *Deleted

## 2013-12-17 MED ORDER — OMEPRAZOLE 20 MG PO CPDR: DELAYED_RELEASE_CAPSULE | ORAL | Status: DC

## 2014-01-13 ENCOUNTER — Ambulatory Visit (INDEPENDENT_AMBULATORY_CARE_PROVIDER_SITE_OTHER): Payer: Medicare HMO | Admitting: Nurse Practitioner

## 2014-01-13 ENCOUNTER — Encounter: Payer: Self-pay | Admitting: Nurse Practitioner

## 2014-01-13 VITALS — BP 156/74 | HR 88 | Ht 62.0 in | Wt 202.0 lb

## 2014-01-13 DIAGNOSIS — Z01419 Encounter for gynecological examination (general) (routine) without abnormal findings: Secondary | ICD-10-CM

## 2014-01-13 DIAGNOSIS — N816 Rectocele: Secondary | ICD-10-CM

## 2014-01-13 DIAGNOSIS — N8111 Cystocele, midline: Secondary | ICD-10-CM

## 2014-01-13 DIAGNOSIS — IMO0002 Reserved for concepts with insufficient information to code with codable children: Secondary | ICD-10-CM

## 2014-01-13 MED ORDER — ESTRADIOL 2 MG VA RING: 2.0000 mg | VAGINAL_RING | VAGINAL | Status: DC

## 2014-01-13 NOTE — Progress Notes (Signed)
Patient ID: Dawn Salinas, female   DOB: [birth year], 68 y.o.   MRN: [id number] 68 y.o. G2P2000 Married Caucasian Fe here for annual exam.  She is doing well except for increased problems with cystocele.  Has to wear pad daily and sometimes soaking.  She has already been evaluated by Urology Dr. Matilde Sprang who saw her on 12/19/12.  She had grade II rectocele and grade I cystocele.  She had been on Estring which really seemed to help - off now for 2 years secondary to cost.  Now considering retrying.  She is ready to consider pessary use.  No LMP recorded. Patient has had a hysterectomy.          Sexually active: no  The current method of family planning is none and abstinence.    Exercising: no  The patient does not participate in regular exercise at present. Smoker:  no  Health Maintenance: Pap: years ago MMG:  12/21/11, Bi-Rads 1: negative Colonoscopy:  2009, negative BMD:  2005 TDaP:  UTD Labs: Care Everywhere in EPIC Urine:  declined   reports that she has never smoked. She has never used smokeless tobacco. She reports that she does not drink alcohol or use illicit drugs.  Past Medical History  Diagnosis Date  . Hypertension   . Allergic rhinitis   . Hyperlipidemia   . GERD (gastroesophageal reflux disease) 10/31/2012    Earlier Dx. with Asthma, but it was due to GERD by Dr. Asencion Noble  . Hip pain, acute 11/2012    Bursitis, got cortisone injection at Dr. Gladstone Lighter  . Diverticulosis of colon (without mention of hemorrhage) 03/2008    Colonoscopy     Past Surgical History  Procedure Laterality Date  . Appendectomy    . Neck surgery  2005    fusion C 4-5  . Total knee arthroplasty Right 1996  . Hysteroscopy  1996    TAH secondary to Endometriosisand ovarian cyctectomy    Current Outpatient Prescriptions  Medication Sig Dispense Refill  . amLODipine (NORVASC) 10 MG tablet Take 10 mg by mouth Daily.       Marland Kitchen aspirin EC 81 MG tablet Take 81 mg by mouth daily.      . celecoxib  (CELEBREX) 100 MG capsule Take 100 mg by mouth 2 (two) times daily.      Marland Kitchen ibuprofen (ADVIL,MOTRIN) 200 MG tablet Take 400 mg by mouth every 6 (six) hours as needed for pain (for hip pain.).       Marland Kitchen lidocaine (LIDODERM) 5 % Place 1 patch onto the skin as directed.      Marland Kitchen oxybutynin (DITROPAN-XL) 10 MG 24 hr tablet Take 1 tablet by mouth daily.      . sertraline (ZOLOFT) 100 MG tablet Take 200 mg by mouth Daily.       . traMADol (ULTRAM) 50 MG tablet Take 50 mg by mouth every 6 (six) hours as needed for pain.      Marland Kitchen esomeprazole (NEXIUM) 40 MG capsule Take 1 capsule (40 mg total) by mouth daily.  30 capsule  6  . estradiol (ESTRING) 2 MG vaginal ring Place 2 mg vaginally every 3 (three) months. Insert a new ring into vagina every 3 months  1 each  3  . omeprazole (PRILOSEC) 20 MG capsule TAKE ONE CAPSULE BY MOUTH DAILY.  30 capsule  6   No current facility-administered medications for this visit.    Family History  Problem Relation Age of Onset  . Heart disease  Mother   . Asthma Sister   . Emphysema Sister     COPD  . Hypertension Mother   . Hypertension Sister   . Heart disease Father   . Heart disease Maternal Grandmother     ROS:  Pertinent items are noted in HPI.  Otherwise, a comprehensive ROS was negative.  Exam:   BP 156/74  Pulse 88  Ht 5\' 2"  (1.575 m)  Wt 202 lb (91.627 kg)  BMI 36.94 kg/m2 Height: 5\' 2"  (157.5 cm)  Ht Readings from Last 3 Encounters:  01/13/14 5\' 2"  (1.575 m)  02/10/13 5\' 2"  (1.575 m)  12/18/12 5\' 2"  (1.575 m)    General appearance: alert, cooperative and appears stated age Head: Normocephalic, without obvious abnormality, atraumatic Neck: no adenopathy, supple, symmetrical, trachea midline and thyroid normal to inspection and palpation Lungs: clear to auscultation bilaterally Breasts: normal appearance, no masses or tenderness Heart: regular rate and rhythm Abdomen: soft, non-tender; no masses,  no organomegaly Extremities: extremities normal,  atraumatic, no cyanosis or edema Skin: Skin color, texture, turgor normal. No rashes or lesions Lymph nodes: Cervical, supraclavicular, and axillary nodes normal. No abnormal inguinal nodes palpated Neurologic: Grossly normal   Pelvic: External genitalia:  no lesions              Urethra:  normal appearing urethra with no masses, tenderness or lesions              Bartholin's and Skene's: normal                 Vagina: atrophic appearing vagina with pale color and discharge, no lesions she has grade I cystocele and grade 2 rectocele              Cervix: absent              Pap taken: no Bimanual Exam:  Uterus:  uterus absent              Adnexa: no mass, fullness, tenderness               Rectovaginal: Confirms               Anus:  normal sphincter tone, no lesions  A:  Well Woman with normal exam  Postmenopausal  Atrophic vaginitis off vaginal estrogen  History of cystocele grade I, history of grade II rectocele  History of Vit D    P:   Pap smear as per guidelines Not done  Mammogram is due now and will schedule  She will get Estring and use for 2-3 months and then come back for Pessary fitting.  Will add Vit D 1000 IU daily records from [facility] reveal low Vit D at 21.4  Counseled on breast self exam, mammography screening, adequate intake of calcium and vitamin D, diet and exercise, Kegel's exercises return annually or prn  An After Visit Summary was printed and given to the patient.

## 2014-01-15 NOTE — Progress Notes (Signed)
Encounter reviewed by Dr. Rashunda Passon Silva.  

## 2014-03-18 ENCOUNTER — Other Ambulatory Visit: Payer: Self-pay | Admitting: Family Medicine

## 2014-03-18 DIAGNOSIS — N63 Unspecified lump in unspecified breast: Secondary | ICD-10-CM

## 2014-03-29 ENCOUNTER — Other Ambulatory Visit: Payer: Medicare Other

## 2014-04-16 ENCOUNTER — Telehealth: Payer: Self-pay | Admitting: *Deleted

## 2014-04-16 NOTE — Telephone Encounter (Signed)
Pt notified of bone density results per written notes on hardcopy.  Pt is agreeable with plan.  She states she is having issues with her shoulder and carpal tunnel and is unable to do upper body weights at this time.  She is having a MRI on her shoulder soon and hopes to be able to resume weights in the near future.  She is agreeable with walking.  Hardcopy sent to scan.

## 2014-05-12 ENCOUNTER — Encounter: Payer: Self-pay | Admitting: Gastroenterology

## 2014-06-29 ENCOUNTER — Encounter: Payer: Self-pay | Admitting: Nurse Practitioner

## 2014-06-29 ENCOUNTER — Ambulatory Visit (INDEPENDENT_AMBULATORY_CARE_PROVIDER_SITE_OTHER): Payer: Medicare HMO | Admitting: Nurse Practitioner

## 2014-06-29 VITALS — BP 154/70 | HR 80 | Ht 62.0 in | Wt 197.0 lb

## 2014-06-29 DIAGNOSIS — N812 Incomplete uterovaginal prolapse: Secondary | ICD-10-CM

## 2014-06-29 DIAGNOSIS — R32 Unspecified urinary incontinence: Secondary | ICD-10-CM

## 2014-06-29 NOTE — Patient Instructions (Signed)
Urinary Incontinence Urinary incontinence is the involuntary loss of urine from your bladder. CAUSES  There are many causes of urinary incontinence. They include:  Medicines.  Infections.  Prostatic enlargement, leading to overflow of urine from your bladder.  Surgery.  Neurological diseases.  Emotional factors. SIGNS AND SYMPTOMS Urinary Incontinence can be divided into four types: 1. Urge incontinence. Urge incontinence is the involuntary loss of urine before you have the opportunity to go to the bathroom. There is a sudden urge to void but not enough time to reach a bathroom. 2. Stress incontinence. Stress incontinence is the sudden loss of urine with any activity that forces urine to pass. It is commonly caused by anatomical changes to the pelvis and sphincter areas of your body. 3. Overflow incontinence. Overflow incontinence is the loss of urine from an obstructed opening to your bladder. This results in a backup of urine and a resultant buildup of pressure within the bladder. When the pressure within the bladder exceeds the closing pressure of the sphincter, the urine overflows, which causes incontinence, similar to water overflowing a dam. 4. Total incontinence. Total incontinence is the loss of urine as a result of the inability to store urine within your bladder. DIAGNOSIS  Evaluating the cause of incontinence may require:  A thorough and complete medical and obstetric history.  A complete physical exam.  Laboratory tests such as a urine culture and sensitivities. When additional tests are indicated, they can include:  An ultrasound exam.  Kidney and bladder X-rays.  Cystoscopy. This is an exam of the bladder using a narrow scope.  Urodynamic testing to test the nerve function to the bladder and sphincter areas. TREATMENT  Treatment for urinary incontinence depends on the cause:  For urge incontinence caused by a bacterial infection, antibiotics will be prescribed.  If the urge incontinence is related to medicines you take, your health care provider may have you change the medicine.  For stress incontinence, surgery to re-establish anatomical support to the bladder or sphincter, or both, will often correct the condition.  For overflow incontinence caused by an enlarged prostate, an operation to open the channel through the enlarged prostate will allow the flow of urine out of the bladder. In women with fibroids, a hysterectomy may be recommended.  For total incontinence, surgery on your urinary sphincter may help. An artificial urinary sphincter (an inflatable cuff placed around the urethra) may be required. In women who have developed a hole-like passage between their bladder and vagina (vesicovaginal fistula), surgery to close the fistula often is required. HOME CARE INSTRUCTIONS  Normal daily hygiene and the use of pads or adult diapers that are changed regularly will help prevent odors and skin damage.  Avoid caffeine. It can overstimulate your bladder.  Use the bathroom regularly. Try about every 2-3 hours to go to the bathroom, even if you do not feel the need to do so. Take time to empty your bladder completely. After urinating, wait a minute. Then try to urinate again.  For causes involving nerve dysfunction, keep a log of the medicines you take and a journal of the times you go to the bathroom. SEEK MEDICAL CARE IF:  You experience worsening of pain instead of improvement in pain after your procedure.  Your incontinence becomes worse instead of better. SEE IMMEDIATE MEDICAL CARE IF:  You experience fever or shaking chills.  You are unable to pass your urine.  You have redness spreading into your groin or down into your thighs. MAKE SURE   YOU:   Understand these instructions.   Will watch your condition.  Will get help right away if you are not doing well or get worse. Document Released: 10/25/2004 Document Revised: 07/08/2013 Document  Reviewed: 02/24/2013 ExitCare Patient Information 2015 [employer]. This information is not intended to replace advice given to you by your health care provider. Make sure you discuss any questions you have with your health care provider.  

## 2014-06-29 NOTE — Progress Notes (Signed)
Encounter reviewed by Dr. Brook Silva.  

## 2014-06-29 NOTE — Progress Notes (Signed)
Subjective:     Patient ID: Dawn Salinas, female   DOB: 03-02-1946, 68 y.o.   MRN: [id number]  HPI this 68 yo WM Fe complains of urinary incontinence.  Symptoms are worse with change of positions such as getting in and out of a car or up from a chair.  States she is ready to proceed and get this taken care of.  She feels this has interfered with her wanting or being able to be SA.  Her husband has a girlfriend because they are not SA.  She wants to get her situation cared for in order for them to be together.   She has had Uro dynamics done in the past and really does not want this again. Dr. Matilde Sprang saw her 12/19/12 for a grade II rectocele and grade I cystocele.  At last AEX she was ready to consider pessary use.  But now does not want that.  Denies urinary symptoms today.  She is using Estring for atrophic vaginitis and is much better with that.   Review of Systems  Constitutional: Negative.   Respiratory: Negative.   Cardiovascular: Negative.   Gastrointestinal: Negative.   Genitourinary: Positive for urgency. Negative for frequency, hematuria, flank pain, decreased urine volume, vaginal bleeding, vaginal discharge, difficulty urinating, vaginal pain, pelvic pain and dyspareunia.  Musculoskeletal: Positive for arthralgias.  Skin: Negative.   Neurological: Negative.   Psychiatric/Behavioral: Negative.        Objective:   Physical Exam  Constitutional: She is oriented to person, place, and time. She appears well-developed and well-nourished.  Abdominal: Soft. She exhibits no distension. There is no tenderness. There is no rebound and no guarding.  Genitourinary:  Very mild cystocele grade Iwith some pelvic relaxation of the uterus.  There is grade I/II rectocele.  Neurological: She is alert and oriented to person, place, and time.  Psychiatric: She has a normal mood and affect. Her behavior is normal. Judgment and thought content normal.       Assessment:     Pelvic relaxation  with cystocele and rectocele Declines use of pessary    Plan:     Will have patient return for a consult visit with Dr. Quincy Simmonds Information is given on Pessary and surgical intervention

## 2014-07-14 ENCOUNTER — Encounter: Payer: Self-pay | Admitting: Obstetrics and Gynecology

## 2014-07-14 ENCOUNTER — Ambulatory Visit (INDEPENDENT_AMBULATORY_CARE_PROVIDER_SITE_OTHER): Payer: Medicare HMO | Admitting: Obstetrics and Gynecology

## 2014-07-14 VITALS — BP 140/60 | HR 80 | Resp 18 | Ht 62.0 in | Wt 196.0 lb

## 2014-07-14 DIAGNOSIS — N898 Other specified noninflammatory disorders of vagina: Secondary | ICD-10-CM

## 2014-07-14 DIAGNOSIS — N3281 Overactive bladder: Secondary | ICD-10-CM

## 2014-07-14 DIAGNOSIS — Q525 Fusion of labia: Secondary | ICD-10-CM

## 2014-07-14 DIAGNOSIS — N8110 Cystocele, unspecified: Secondary | ICD-10-CM

## 2014-07-14 DIAGNOSIS — N952 Postmenopausal atrophic vaginitis: Secondary | ICD-10-CM

## 2014-07-14 DIAGNOSIS — IMO0002 Reserved for concepts with insufficient information to code with codable children: Secondary | ICD-10-CM

## 2014-07-14 DIAGNOSIS — N816 Rectocele: Secondary | ICD-10-CM

## 2014-07-14 MED ORDER — OXYBUTYNIN CHLORIDE ER 15 MG PO TB24: 15.0000 mg | ORAL_TABLET | Freq: Every day | ORAL | Status: DC

## 2014-07-14 NOTE — Patient Instructions (Signed)
We will call you to schedule your biopsy appointment.

## 2014-07-14 NOTE — Progress Notes (Signed)
GYNECOLOGY  VISIT   HPI: 68 y.o.   Married  Caucasian  female   G2P2000 with Patient's last menstrual period was 10/01/1994. - Status post total abdominal hysterectomy - endometriosis. Uncertain if she still has ovaries.  Status post TOT 2005 here for evaluation of urinary incontinence.    Long standing history of bladder control issues.  Had bed wetting as a child.  Notes worsening of bladder control with weight gain.  Up to empty bladder several times a night.  Uses a pad in addition to a Depends brief.  Has difficulty with the lack of predictibility of leakage of urine.  DF - every 1.5 hours. NF - 3 times. Affecting sexual functioning with husband.  Is not always having urge prior to leakage.  Leaks with shifting position but not really with cough, laugh.  Can leak at times with a sneeze, but this is not really the issue.  Does not really exercise or any heavy lifting so she does not know if she leaks.   Has had urologic consultation and care. Status post TOT sling in 2005 - Dr. Risa Grill.  Urodynamics done in the past and really does not want this again due to pain. - Dr. Matilde Sprang. Urodynamics - no leak with valsalva pressure of 213 cm H20.   Diagnosed with a large capacity poorly contractile bladder that was hyposensitive.  Has used Myrbetriq, Oxybutynin, Toviaz, and Vesicare.  Currently on Ditropan XL 10 mg daily and would like to increase the dosage.  Likes the Vesicare the best, but it is too expensive for the patient.   Has sacroiliac pain.   Has vaginal pain and is taking Premarin cream.  Really liked the Estring but it is expensive. This is helping some with atrophy and pain.   Denies hematuria and dysuria.  Has frequent UTIs. Occur twice a year.  No renal stones.   GYNECOLOGIC HISTORY: Patient's last menstrual period was 10/01/1994. Contraception:   Hysterectomy Menopausal hormone therapy: Estring         OB History   Grav Para Term Preterm Abortions TAB SAB  Ect Mult Living   2 2 2  0 0              Patient Active Problem List   Diagnosis Date Noted  . Urinary, incontinence, stress female 12/18/2012  . Urge and stress incontinence 11/10/2012  . Situational anxiety 11/10/2012  . 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 ABNORMAL ELECTROCARDIOGRAM 09/14/2010  . FIBROMYALGIA 09/13/2010  . ABDOMINAL PAIN, GENERALIZED 08/22/2010  . HYPERCHOLESTEROLEMIA 08/17/2010  . DEPRESSION 08/17/2010  . HYPERTENSION 08/17/2010  . UMB HERNIA WITHOUT MENTION OBSTRUCTION/GANGRENE 08/17/2010  . DIVERTICULOSIS, COLON 08/17/2010  . FATIGUE 08/17/2010  . NAUSEA AND VOMITING 08/17/2010  . ABDOMINAL PAIN 08/17/2010  . PRE-DIABETES 08/17/2010    Past Medical History  Diagnosis Date  . Hypertension   . Allergic rhinitis   . Hyperlipidemia   . GERD (gastroesophageal reflux disease) 10/31/2012    Earlier Dx. with Asthma, but it was due to GERD by Dr. Asencion Noble  . Hip pain, acute 11/2012    Bursitis, got cortisone injection at Dr. Gladstone Lighter  . Diverticulosis of colon (without mention of hemorrhage) 03/2008    Colonoscopy     Past Surgical History  Procedure Laterality Date  . Appendectomy    . Neck surgery  2005    fusion C 4-5  . Total knee arthroplasty Right 1996  . Hysteroscopy  1996  TAH secondary to Endometriosisand ovarian cyctectomy    Current Outpatient Prescriptions  Medication Sig Dispense Refill  . amLODipine-benazepril (LOTREL) 10-20 MG per capsule Take 1 capsule by mouth daily.      . celecoxib (CELEBREX) 100 MG capsule Take 100 mg by mouth 2 (two) times daily.      Marland Kitchen estradiol (ESTRING) 2 MG vaginal ring Place 2 mg vaginally every 3 (three) months. Insert a new ring into vagina every 3 months  1 each  3  . ibuprofen (ADVIL,MOTRIN) 200 MG tablet Take 400 mg by mouth every 6 (six) hours as needed for pain (for hip pain.).       Marland Kitchen omeprazole (PRILOSEC) 20 MG capsule TAKE ONE CAPSULE BY  MOUTH DAILY.  30 capsule  6  . oxybutynin (DITROPAN-XL) 10 MG 24 hr tablet Take 1 tablet by mouth daily.      . sertraline (ZOLOFT) 100 MG tablet Take 200 mg by mouth Daily.       . traMADol (ULTRAM) 50 MG tablet Take 50 mg by mouth every 6 (six) hours as needed for pain.      Marland Kitchen esomeprazole (NEXIUM) 40 MG capsule Take 1 capsule (40 mg total) by mouth daily.  30 capsule  6   No current facility-administered medications for this visit.     ALLERGIES: Cephalexin and Codeine  Family History  Problem Relation Age of Onset  . Heart disease Mother   . Asthma Sister   . Emphysema Sister     COPD  . Hypertension Mother   . Hypertension Sister   . Heart disease Father   . Heart disease Maternal Grandmother     History   Social History  . Marital Status: Married    Spouse Name: Rylann Munford    Number of Children: 2  . Years of Education: 12   Occupational History  . Retired Astronomer    Social History Main Topics  . Smoking status: Never Smoker   . Smokeless tobacco: Never Used  . Alcohol Use: No  . Drug Use: No  . Sexual Activity: No   Other Topics Concern  . Not on file   Social History Narrative  . No narrative on file    ROS:  Pertinent items are noted in HPI.  PHYSICAL EXAMINATION:    BP 140/60  Pulse 80  Resp 18  Ht 5\' 2"  (1.575 m)  Wt 196 lb (88.905 kg)  BMI 35.84 kg/m2  LMP 10/01/1994     General appearance: alert, cooperative and appears stated age Abdomen: umbilical incision (has mesh), Pfannenstiel incision, soft, non-tender; no masses,  no organomegaly No abnormal inguinal nodes palpated  Pelvic: External genitalia:  Fusion of the labia majora superiorly.              Urethra:  normal appearing urethra with no masses, tenderness or lesions              Bartholins and Skenes: normal                 Vagina: normal appearing vagina with normal color and discharge, minimal cystocele and rectocele.  1 cm raised erythematous lesion of the posterior vaginal  midvaginal mucosa in the midline.               Cervix:  absent                   Bimanual Exam:  Uterus: absent  Adnexa: no masses                                      Rectovaginal:  Yes.                                                                Confirms                                      Anus:  normal sphincter tone, no lesions  ASSESSMENT  Overactive bladder syndrome.  On Ditropan XL 10 mg.  Liked Vesicare best. Atrophic vaginal changes.  Using Premarin cream.  Liked Estring best. 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 Vaginal lesion. Mild cystocele and rectocele.   PLAN  Extensive and comprehensive discussion regarding overactive bladder - treatments thereof including medical therapy, Interstim, Botox, and even artificial sphincter with catheterization for severe cases.  Will increase to Ditropan XL 15 mg at this time.  Discussed vaginal estrogen for treatment of atrophy.  Will return for both vulvar and vaginal biopsy.  I do not believe that a pessary will significantly change any of the patient's bladder and incontinence symptoms.  I would be concerned that it may add to her discomfort, but it is not contraindicated.  We both agree that she will not do this at this time.    An After Visit Summary was printed and given to the patient.  __50____ minutes face to face time of which over 50% was spent in counseling.

## 2014-07-27 ENCOUNTER — Telehealth: Payer: Self-pay | Admitting: Obstetrics and Gynecology

## 2014-07-27 NOTE — Telephone Encounter (Signed)
Spoke with patient. Advised that per benefit quote received, she will be responsible for $40 copay when she comes in for vaginal/vulvar bx. Patient agreeable. Scheduled procedures.

## 2014-08-02 ENCOUNTER — Encounter: Payer: Self-pay | Admitting: Obstetrics and Gynecology

## 2014-08-06 ENCOUNTER — Ambulatory Visit (INDEPENDENT_AMBULATORY_CARE_PROVIDER_SITE_OTHER): Payer: Medicare HMO | Admitting: Obstetrics and Gynecology

## 2014-08-06 ENCOUNTER — Encounter: Payer: Self-pay | Admitting: Obstetrics and Gynecology

## 2014-08-06 VITALS — BP 140/70 | HR 84 | Ht 62.0 in | Wt 195.4 lb

## 2014-08-06 DIAGNOSIS — N766 Ulceration of vulva: Secondary | ICD-10-CM | POA: Diagnosis not present

## 2014-08-06 DIAGNOSIS — N898 Other specified noninflammatory disorders of vagina: Secondary | ICD-10-CM | POA: Diagnosis not present

## 2014-08-06 DIAGNOSIS — Q525 Fusion of labia: Secondary | ICD-10-CM

## 2014-08-06 NOTE — Progress Notes (Signed)
Patient ID: Dawn Salinas, female   DOB: 03-Mar-1946, 68 y.o.   MRN: [id number] GYNECOLOGY  VISIT   HPI: 68 y.o.   Married  Caucasian  female   G2P2000 with Patient's last menstrual period was 10/01/1994.   here for Vulvar biopsy and vulvar biopsy.  Has some thinning of the vulvar tissue and labial fusion suggestive of lichen sclerosus and a red raised lesion of the posterior vaginal mucosa.  Asking for prior authorization for Estring.  Has tried Estrace and Premarin creams which are not as effective.   Not sexually active.  States her husband has a girlfriend. Patient want to return to normal functioning and have good bladder control and less urinary leakage.  GYNECOLOGIC HISTORY: Patient's last menstrual period was 10/01/1994. Contraception: hysterectomy   Menopausal hormone therapy: Estring        OB History    Gravida Para Term Preterm AB TAB SAB Ectopic Multiple Living   2 2 2  0 0              Patient Active Problem List   Diagnosis Date Noted  . Urinary, incontinence, stress female 12/18/2012  . Urge and stress incontinence 11/10/2012  . Situational anxiety 11/10/2012  . 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 ABNORMAL ELECTROCARDIOGRAM 09/14/2010  . FIBROMYALGIA 09/13/2010  . ABDOMINAL PAIN, GENERALIZED 08/22/2010  . HYPERCHOLESTEROLEMIA 08/17/2010  . DEPRESSION 08/17/2010  . HYPERTENSION 08/17/2010  . UMB HERNIA WITHOUT MENTION OBSTRUCTION/GANGRENE 08/17/2010  . DIVERTICULOSIS, COLON 08/17/2010  . FATIGUE 08/17/2010  . NAUSEA AND VOMITING 08/17/2010  . ABDOMINAL PAIN 08/17/2010  . PRE-DIABETES 08/17/2010    Past Medical History  Diagnosis Date  . Hypertension   . Allergic rhinitis   . Hyperlipidemia   . GERD (gastroesophageal reflux disease) 10/31/2012    Earlier Dx. with Asthma, but it was due to GERD by Dr. Asencion Noble  . Hip pain, acute 11/2012    Bursitis, got cortisone injection at Dr. Gladstone Lighter  .  Diverticulosis of colon (without mention of hemorrhage) 03/2008    Colonoscopy     Past Surgical History  Procedure Laterality Date  . Appendectomy    . Neck surgery  2005    fusion C 4-5  . Total knee arthroplasty Right 1996  . Hysteroscopy  1996    TAH secondary to Endometriosisand ovarian cyctectomy    Current Outpatient Prescriptions  Medication Sig Dispense Refill  . amLODipine-benazepril (LOTREL) 10-20 MG per capsule Take 1 capsule by mouth daily.    . celecoxib (CELEBREX) 100 MG capsule Take 100 mg by mouth 2 (two) times daily.    Marland Kitchen estradiol (ESTRING) 2 MG vaginal ring Place 2 mg vaginally every 3 (three) months. Insert a new ring into vagina every 3 months 1 each 3  . ibuprofen (ADVIL,MOTRIN) 200 MG tablet Take 400 mg by mouth every 6 (six) hours as needed for pain (for hip pain.).     Marland Kitchen oxybutynin (DITROPAN XL) 15 MG 24 hr tablet Take 1 tablet (15 mg total) by mouth at bedtime. 30 tablet 5  . sertraline (ZOLOFT) 100 MG tablet Take 200 mg by mouth Daily.     . traMADol (ULTRAM) 50 MG tablet Take 50 mg by mouth every 6 (six) hours as needed for pain.    Marland Kitchen omeprazole (PRILOSEC) 20 MG capsule Take 1 capsule by mouth daily.  0   No current facility-administered medications for this visit.     ALLERGIES: Cephalexin and Codeine  Family History  Problem Relation Age of Onset  . Heart disease Mother   . Asthma Sister   . Emphysema Sister     COPD  . Hypertension Mother   . Hypertension Sister   . Heart disease Father   . Heart disease Maternal Grandmother     History   Social History  . Marital Status: Married    Spouse Name: Hydee Fleece    Number of Children: 2  . Years of Education: 12   Occupational History  . Retired Astronomer    Social History Main Topics  . Smoking status: Never Smoker   . Smokeless tobacco: Never Used  . Alcohol Use: No  . Drug Use: No  . Sexual Activity: No     Comment: Hyst   Other Topics Concern  . Not on file   Social History  Narrative    ROS:  Pertinent items are noted in HPI.  PHYSICAL EXAMINATION:    BP 140/70 mmHg  Pulse 84  Ht 5\' 2"  (1.575 m)  Wt 195 lb 6.4 oz (88.633 kg)  BMI 35.73 kg/m2  LMP 10/01/1994     General appearance: alert, cooperative and appears stated age Lungs: clear to auscultation bilaterally Heart: regular rate and rhythm Abdomen: soft, non-tender; no masses,  no organomegaly No abnormal inguinal nodes palpated  Pelvic: External genitalia:  Fusion of the labia majora superiorly.  Ulceration of the perineal body.               Urethra:  normal appearing urethra with no masses, tenderness or lesions              Bartholins and Skenes: normal                 Vagina: normal appearing vagina with normal color and discharge, 1 cm red raised well circumscribed area of the posterior mid right vagina.              Cervix:  absent            Procedure Consent for vaginal and vulvar biopsy.  Speculum placed in vagina.  Acetic acid used.  Colposcopic visualization of the vaginal lesion.  No acetowhite change noted. Punch forceps used to remove biopsy specimen.  Sent to pathology. Monsel's placed. Good hemostasis.  Speculum removed.   Vulva examined.  Sterile prep to perineum.  Lidocaine 1% used - lot # [id number], Expiration 03/02/15. 3 mm punch biopsy used.  Tissue sent to pathology.  AgNO3 and then 1 suture of 3/0 vicryl. Good hemostasis.   No complications.                                  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  Vaginal lesion. Urinary incontinence.  Atrophy of genital tract.  PLAN  Follow up biopsies.  Instructions and precautions given.  Discussion regarding returning to urology for Botox or Interstim.  Discussion regarding the Estring.  Patient will need to present requests for prescription and then a form will be generated for potential insurance authorization.  Patient understands that we do not have these forms on hand.  She has used vaginal  creams without relief, so the Estring is a good option for her.     An After Visit Summary was printed and given to the patient.  __15____ minutes face to face time of which over 50% was spent in counseling discussing lichen  sclerosus, atrophy, and urinary incontinence.

## 2014-08-06 NOTE — Patient Instructions (Signed)

## 2014-08-12 LAB — IPS OTHER TISSUE BIOPSY

## 2014-09-01 ENCOUNTER — Other Ambulatory Visit: Payer: Self-pay | Admitting: Obstetrics and Gynecology

## 2014-09-01 ENCOUNTER — Encounter: Payer: Self-pay | Admitting: Obstetrics and Gynecology

## 2014-09-01 MED ORDER — ESTRADIOL 2 MG VA RING: 2.0000 mg | VAGINAL_RING | VAGINAL | Status: DC

## 2014-09-16 ENCOUNTER — Telehealth: Payer: Self-pay

## 2014-09-16 NOTE — Telephone Encounter (Signed)
-----   Message from [place], MD sent at 09/15/2014  6:45 AM EST ----- Regarding: please be sure that patient knows her Estring was sent to her pharmacy Please contact patient to let her know that her Estring was sent to her pharmacy.   She did not open her My Chart message that I sent this to her pharmacy.   Thanks.

## 2014-09-16 NOTE — Telephone Encounter (Signed)
Spoke with patient and advised Dr. Quincy Simmonds sent in RX for Estring to her pharmacy.  She thanked me for my call.

## 2015-03-28 ENCOUNTER — Other Ambulatory Visit: Payer: Self-pay

## 2015-03-30 ENCOUNTER — Other Ambulatory Visit: Payer: Self-pay | Admitting: Critical Care Medicine

## 2015-05-03 ENCOUNTER — Other Ambulatory Visit: Payer: Self-pay | Admitting: Critical Care Medicine

## 2015-06-08 ENCOUNTER — Other Ambulatory Visit: Payer: Self-pay | Admitting: Obstetrics and Gynecology

## 2015-06-09 NOTE — Telephone Encounter (Signed)
Medication refill request: Ditropan Last AEX:  01/13/14 with PG Next AEX: Not scheduled Last MMG (if hormonal medication request):  Refill authorized: Please advise  Patient was given a call and message was left to AEX, waiting for patient to call back.

## 2015-07-13 ENCOUNTER — Other Ambulatory Visit: Payer: Self-pay | Admitting: Nurse Practitioner

## 2015-07-13 NOTE — Telephone Encounter (Signed)
Patient needs an AEX

## 2015-09-29 NOTE — H&P (Signed)
Dawn Salinas is an 69 y.o. female.    Chief Complaint: left shoulder pain  HPI: Pt is a 69 y.o. female complaining of left shoulder pain for multiple years. Pain had continually increased since the beginning. X-rays in the clinic show end-stage arthritic changes of the left shoulder. Pt has tried various conservative treatments which have failed to alleviate their symptoms, including injections and therapy. Various options are discussed with the patient. Risks, benefits and expectations were discussed with the patient. Patient understand the risks, benefits and expectations and wishes to proceed with surgery.   PCP:  Chesley Noon, MD  D/C Plans: Home  PMH: Past Medical History  Diagnosis Date  . Hypertension   . Allergic rhinitis   . Hyperlipidemia   . GERD (gastroesophageal reflux disease) 10/31/2012    Earlier Dx. with Asthma, but it was due to GERD by Dr. Asencion Noble  . Hip pain, acute 11/2012    Bursitis, got cortisone injection at Dr. Gladstone Lighter  . Diverticulosis of colon (without mention of hemorrhage) 03/2008    Colonoscopy     PSH: Past Surgical History  Procedure Laterality Date  . Appendectomy    . Neck surgery  2005    fusion C 4-5  . Total knee arthroplasty Right 1996  . Hysteroscopy  1996    TAH secondary to Endometriosisand ovarian cyctectomy    Social History:  reports that she has never smoked. She has never used smokeless tobacco. She reports that she does not drink alcohol or use illicit drugs.  Allergies:  Allergies  Allergen Reactions  . Cephalexin Itching and Rash  . Codeine Itching and Rash    REACTION: rash, itching, hives    Medications: No current facility-administered medications for this encounter.   Current Outpatient Prescriptions  Medication Sig Dispense Refill  . amLODipine (NORVASC) 10 MG tablet Take 10 mg by mouth daily.  0  . amLODipine-benazepril (LOTREL) 10-20 MG per capsule Take 1 capsule by mouth daily.    . celecoxib  (CELEBREX) 100 MG capsule Take 100 mg by mouth 2 (two) times daily.    Marland Kitchen estradiol (ESTRING) 2 MG vaginal ring Place 2 mg vaginally every 3 (three) months. Insert a new ring into vagina every 3 months 1 each 1  . ibuprofen (ADVIL,MOTRIN) 200 MG tablet Take 400 mg by mouth every 6 (six) hours as needed for pain (for hip pain.).     Marland Kitchen omeprazole (PRILOSEC) 20 MG capsule TAKE 1 CAPSULE BY MOUTH EVERY DAY 30 capsule 0  . oxybutynin (DITROPAN XL) 15 MG 24 hr tablet TAKE 1 TABLET BY MOUTH AT BEDTIME 30 tablet 0  . sertraline (ZOLOFT) 100 MG tablet Take 200 mg by mouth Daily.     . traMADol (ULTRAM) 50 MG tablet Take 50 mg by mouth every 6 (six) hours as needed for pain.      No results found for this or any previous visit (from the past 48 hour(s)). No results found.  ROS: Pain with rom of the left upper extremity  Physical Exam:  Alert and oriented 69 y.o. female in no acute distress Cranial nerves 2-12 intact Cervical spine: full rom with no tenderness, nv intact distally Chest: active breath sounds bilaterally, no wheeze rhonchi or rales Heart: regular rate and rhythm, no murmur Abd: non tender non distended with active bowel sounds Hip is stable with rom  Left shoulder with decreased rom nv intact distally No rashes or edema Crepitus with rom  Assessment/Plan Assessment: left shoulder end  stage osteoarthritis  Plan: Patient will undergo a left total shoulder arthroplasty by Dr. Veverly Fells at [facility]. Risks benefits and expectations were discussed with the patient. Patient understand risks, benefits and expectations and wishes to proceed.

## 2015-10-12 ENCOUNTER — Encounter (HOSPITAL_COMMUNITY)
Admission: RE | Admit: 2015-10-12 | Discharge: 2015-10-12 | Disposition: A | Discharge status: Home / Self Care | Payer: Medicare HMO | Source: Ambulatory Visit | Attending: Orthopedic Surgery | Admitting: Orthopedic Surgery

## 2015-10-12 ENCOUNTER — Encounter (HOSPITAL_COMMUNITY): Payer: Self-pay

## 2015-10-12 DIAGNOSIS — M19012 Primary osteoarthritis, left shoulder: Secondary | ICD-10-CM | POA: Insufficient documentation

## 2015-10-12 DIAGNOSIS — Z01812 Encounter for preprocedural laboratory examination: Secondary | ICD-10-CM | POA: Insufficient documentation

## 2015-10-12 HISTORY — DX: Depression, unspecified: F32.A

## 2015-10-12 HISTORY — DX: Major depressive disorder, single episode, unspecified: F32.9

## 2015-10-12 LAB — CBC
HEMATOCRIT: 41.1 % (ref 36.0–46.0)
Hemoglobin: 13.3 g/dL (ref 12.0–15.0)
MCH: 27.9 pg (ref 26.0–34.0)
MCHC: 32.4 g/dL (ref 30.0–36.0)
MCV: 86.3 fL (ref 78.0–100.0)
Platelets: 310 10*3/uL (ref 150–400)
RBC: 4.76 MIL/uL (ref 3.87–5.11)
RDW: 14.2 % (ref 11.5–15.5)
WBC: 11 10*3/uL — ABNORMAL HIGH (ref 4.0–10.5)

## 2015-10-12 LAB — BASIC METABOLIC PANEL
Anion gap: 9 (ref 5–15)
BUN: 18 mg/dL (ref 6–20)
CALCIUM: 9.3 mg/dL (ref 8.9–10.3)
CO2: 27 mmol/L (ref 22–32)
CREATININE: 0.65 mg/dL (ref 0.44–1.00)
Chloride: 103 mmol/L (ref 101–111)
GFR calc Af Amer: >60 mL/min (ref >60)
GFR calc non Af Amer: >60 mL/min (ref >60)
GLUCOSE: 102 mg/dL — AB (ref 65–99)
Potassium: 4.5 mmol/L (ref 3.5–5.1)
Sodium: 139 mmol/L (ref 135–145)

## 2015-10-12 LAB — SURGICAL PCR SCREEN
MRSA, PCR: POSITIVE — AB
Staphylococcus aureus: POSITIVE — AB

## 2015-10-12 NOTE — Pre-Procedure Instructions (Signed)
Dawn Salinas  10/12/2015      [facility] [place], [place] [address] [place] [address] Phone: [phone] Fax: [phone]  [id number], [address] [place] [address] Phone: [phone] Fax: [phone]  [id number] - [place], [place] [place] [address] Phone: [phone] Fax: [place] [address] - [place], [place] [address] [place] [address] Phone: [phone] Fax: [phone]    Your procedure is scheduled on   Friday  10/21/15  Report to [facility] at 800 A.M.  Call this number if you have problems the morning of surgery:  [phone]   Remember:  Do not eat food or drink liquids after midnight.  Take these medicines the morning of surgery with A SIP OF WATER  omeprazole, sertraline, tramadol if needed ( STOP CELEBREX, IBUPROFEN/ ADVIL/ MOTRIN)   Do not wear jewelry, make-up or nail polish.  Do not wear lotions, powders, or perfumes.  You may wear deodorant.  Do not shave 48 hours prior to surgery.  Men may shave face and neck.  Do not bring valuables to the hospital.  [facility] is not responsible for any belongings or valuables.  Contacts, dentures or bridgework may not be worn into surgery.  Leave your suitcase in the car.  After surgery it may be brought to your room.  For patients admitted to the hospital, discharge time will be determined by your treatment team.  Patients discharged the day of surgery will not be allowed to drive home.   Name and phone number of your driver:   Special instructions:  [facility] - Preparing for Surgery  Before surgery, you can play an important role.  Because skin is not sterile, your skin  needs to be as free of germs as possible.  You can reduce the number of germs on you skin by washing with CHG (chlorahexidine gluconate) soap before surgery.  CHG is an antiseptic cleaner which kills germs and bonds with the skin to continue killing germs even after washing.  Please DO NOT use if you have an allergy to CHG or antibacterial soaps.  If your skin becomes reddened/irritated stop using the CHG and inform your nurse when you arrive at Short Stay.  Do not shave (including legs and underarms) for at least 48 hours prior to the first CHG shower.  You may shave your face.  Please follow these instructions carefully:   1.  Shower with CHG Soap the night before surgery and the                                morning of Surgery.  2.  If you choose to wash your hair, wash your hair first as usual with your       normal shampoo.  3.  After you shampoo, rinse your hair and body thoroughly to remove the                      Shampoo.  4.  Use CHG as you would any other liquid soap.  You can apply chg directly       to the skin and wash gently  with scrungie or a clean washcloth.  5.  Apply the CHG Soap to your body ONLY FROM THE NECK DOWN.        Do not use on open wounds or open sores.  Avoid contact with your eyes,       ears, mouth and genitals (private parts).  Wash genitals (private parts)       with your normal soap.  6.  Wash thoroughly, paying special attention to the area where your surgery        will be performed.  7.  Thoroughly rinse your body with warm water from the neck down.  8.  DO NOT shower/wash with your normal soap after using and rinsing off       the CHG Soap.  9.  Pat yourself dry with a clean towel.            10.  Wear clean pajamas.            11.  Place clean sheets on your bed the night of your first shower and do not        sleep with pets.  Day of Surgery  Do not apply any lotions/deoderants the morning of surgery.  Please wear clean clothes to the hospital/surgery  center.    Please read over the following fact sheets that you were given. Pain Booklet, Coughing and Deep Breathing, Blood Transfusion Information, MRSA Information and Surgical Site Infection Prevention

## 2015-10-12 NOTE — Progress Notes (Signed)
Mupirocin Ointment Rx called into [facility] on [address] for positive PCR of MRSA. Attempted to call pt, no answer and voicemail available.

## 2015-10-20 MED ORDER — CLINDAMYCIN PHOSPHATE 900 MG/50ML IV SOLN
900.0000 mg | INTRAVENOUS | Status: DC
  Filled 2015-10-20: qty 50

## 2015-10-21 ENCOUNTER — Inpatient Hospital Stay (HOSPITAL_COMMUNITY): Payer: Medicare HMO

## 2015-10-21 ENCOUNTER — Inpatient Hospital Stay (HOSPITAL_COMMUNITY): Payer: Medicare HMO | Admitting: Anesthesiology

## 2015-10-21 ENCOUNTER — Inpatient Hospital Stay (HOSPITAL_COMMUNITY)
Admission: RE | Admit: 2015-10-21 | Discharge: 2015-10-24 | DRG: 483 | Disposition: A | Discharge status: Skilled Nursing Facility | Payer: Medicare HMO | Source: Ambulatory Visit | Attending: Orthopedic Surgery | Admitting: Orthopedic Surgery

## 2015-10-21 ENCOUNTER — Encounter (HOSPITAL_COMMUNITY): Payer: Self-pay | Admitting: *Deleted

## 2015-10-21 ENCOUNTER — Encounter (HOSPITAL_COMMUNITY)
Admission: RE | Disposition: A | Discharge status: Skilled Nursing Facility | Payer: Medicare HMO | Source: Ambulatory Visit | Attending: Orthopedic Surgery

## 2015-10-21 DIAGNOSIS — M19012 Primary osteoarthritis, left shoulder: Principal | ICD-10-CM | POA: Diagnosis present

## 2015-10-21 DIAGNOSIS — Z23 Encounter for immunization: Secondary | ICD-10-CM

## 2015-10-21 DIAGNOSIS — E785 Hyperlipidemia, unspecified: Secondary | ICD-10-CM | POA: Diagnosis present

## 2015-10-21 DIAGNOSIS — Z96651 Presence of right artificial knee joint: Secondary | ICD-10-CM | POA: Diagnosis present

## 2015-10-21 DIAGNOSIS — Z981 Arthrodesis status: Secondary | ICD-10-CM

## 2015-10-21 DIAGNOSIS — Z96619 Presence of unspecified artificial shoulder joint: Secondary | ICD-10-CM

## 2015-10-21 DIAGNOSIS — Z96612 Presence of left artificial shoulder joint: Secondary | ICD-10-CM

## 2015-10-21 DIAGNOSIS — M25512 Pain in left shoulder: Secondary | ICD-10-CM | POA: Diagnosis present

## 2015-10-21 DIAGNOSIS — Z79899 Other long term (current) drug therapy: Secondary | ICD-10-CM

## 2015-10-21 DIAGNOSIS — I10 Essential (primary) hypertension: Secondary | ICD-10-CM | POA: Diagnosis present

## 2015-10-21 DIAGNOSIS — K219 Gastro-esophageal reflux disease without esophagitis: Secondary | ICD-10-CM | POA: Diagnosis present

## 2015-10-21 HISTORY — PX: TOTAL SHOULDER ARTHROPLASTY: SHX126

## 2015-10-21 SURGERY — ARTHROPLASTY, SHOULDER, TOTAL
Anesthesia: General | Laterality: Left

## 2015-10-21 MED ORDER — BISACODYL 10 MG RE SUPP: 10.0000 mg | Freq: Every day | RECTAL | Status: DC | PRN

## 2015-10-21 MED ORDER — PNEUMOCOCCAL VAC POLYVALENT 25 MCG/0.5ML IJ INJ
0.5000 mL | INJECTION | INTRAMUSCULAR | Status: AC
  Administered 2015-10-22: 0.5 mL via INTRAMUSCULAR
  Filled 2015-10-21: qty 0.5

## 2015-10-21 MED ORDER — MIDAZOLAM HCL 5 MG/ML IJ SOLN: 2.0000 mg | Freq: Once | INTRAMUSCULAR | Status: DC

## 2015-10-21 MED ORDER — FENTANYL CITRATE (PF) 250 MCG/5ML IJ SOLN
INTRAMUSCULAR | Status: AC
  Filled 2015-10-21: qty 5

## 2015-10-21 MED ORDER — HYDROMORPHONE HCL 1 MG/ML IJ SOLN: 0.2500 mg | INTRAMUSCULAR | Status: DC | PRN

## 2015-10-21 MED ORDER — PHENYLEPHRINE HCL 10 MG/ML IJ SOLN
INTRAMUSCULAR | Status: DC | PRN
  Administered 2015-10-21 (×3): 80 ug via INTRAVENOUS

## 2015-10-21 MED ORDER — SODIUM CHLORIDE 0.9 % IV SOLN
INTRAVENOUS | Status: DC
  Administered 2015-10-21: 19:00:00 via INTRAVENOUS

## 2015-10-21 MED ORDER — LIDOCAINE HCL (CARDIAC) 20 MG/ML IV SOLN
INTRAVENOUS | Status: AC
  Filled 2015-10-21: qty 5

## 2015-10-21 MED ORDER — OXYCODONE-ACETAMINOPHEN 5-325 MG PO TABS: 1.0000 | ORAL_TABLET | ORAL | Status: DC | PRN

## 2015-10-21 MED ORDER — AMLODIPINE BESY-BENAZEPRIL HCL 10-20 MG PO CAPS: 1.0000 | ORAL_CAPSULE | Freq: Every day | ORAL | Status: DC

## 2015-10-21 MED ORDER — METHOCARBAMOL 500 MG PO TABS
500.0000 mg | ORAL_TABLET | Freq: Four times a day (QID) | ORAL | Status: DC | PRN
  Administered 2015-10-22 – 2015-10-24 (×3): 500 mg via ORAL
  Filled 2015-10-21 (×3): qty 1

## 2015-10-21 MED ORDER — GLYCOPYRROLATE 0.2 MG/ML IJ SOLN
INTRAMUSCULAR | Status: DC | PRN
  Administered 2015-10-21: .2 mg via INTRAVENOUS

## 2015-10-21 MED ORDER — METHOCARBAMOL 1000 MG/10ML IJ SOLN
500.0000 mg | Freq: Four times a day (QID) | INTRAMUSCULAR | Status: DC | PRN
  Filled 2015-10-21: qty 5

## 2015-10-21 MED ORDER — FENTANYL CITRATE (PF) 100 MCG/2ML IJ SOLN
INTRAMUSCULAR | Status: DC | PRN
  Administered 2015-10-21: 50 ug via INTRAVENOUS

## 2015-10-21 MED ORDER — BUPIVACAINE-EPINEPHRINE (PF) 0.25% -1:200000 IJ SOLN
INTRAMUSCULAR | Status: AC
Start: 2015-10-21 — End: 2015-10-21
  Filled 2015-10-21: qty 30

## 2015-10-21 MED ORDER — PROPOFOL 10 MG/ML IV BOLUS
INTRAVENOUS | Status: AC
  Filled 2015-10-21: qty 20

## 2015-10-21 MED ORDER — MEPERIDINE HCL 25 MG/ML IJ SOLN: 6.2500 mg | INTRAMUSCULAR | Status: DC | PRN

## 2015-10-21 MED ORDER — MIDAZOLAM HCL 2 MG/2ML IJ SOLN
INTRAMUSCULAR | Status: AC
  Administered 2015-10-21: 2 mg
  Filled 2015-10-21: qty 2

## 2015-10-21 MED ORDER — TRAMADOL HCL 50 MG PO TABS
50.0000 mg | ORAL_TABLET | Freq: Four times a day (QID) | ORAL | Status: DC | PRN
  Administered 2015-10-22: 100 mg via ORAL
  Filled 2015-10-21: qty 2

## 2015-10-21 MED ORDER — BENAZEPRIL HCL 20 MG PO TABS
20.0000 mg | ORAL_TABLET | Freq: Every day | ORAL | Status: DC
  Administered 2015-10-22: 20 mg via ORAL
  Filled 2015-10-21: qty 1

## 2015-10-21 MED ORDER — VANCOMYCIN HCL IN DEXTROSE 1-5 GM/200ML-% IV SOLN
INTRAVENOUS | Status: AC
  Administered 2015-10-21: 1000 mg via INTRAVENOUS
  Filled 2015-10-21: qty 200

## 2015-10-21 MED ORDER — METHOCARBAMOL 500 MG PO TABS: 500.0000 mg | ORAL_TABLET | Freq: Three times a day (TID) | ORAL | Status: DC | PRN

## 2015-10-21 MED ORDER — SERTRALINE HCL 100 MG PO TABS
200.0000 mg | ORAL_TABLET | Freq: Every day | ORAL | Status: DC
  Administered 2015-10-22 – 2015-10-24 (×3): 200 mg via ORAL
  Filled 2015-10-21 (×3): qty 2

## 2015-10-21 MED ORDER — CHLORHEXIDINE GLUCONATE 4 % EX LIQD
60.0000 mL | Freq: Once | CUTANEOUS | Status: DC
Start: 2015-10-21 — End: 2015-10-21

## 2015-10-21 MED ORDER — ONDANSETRON HCL 4 MG/2ML IJ SOLN
INTRAMUSCULAR | Status: DC | PRN
  Administered 2015-10-21: 4 mg via INTRAVENOUS

## 2015-10-21 MED ORDER — SUGAMMADEX SODIUM 200 MG/2ML IV SOLN
INTRAVENOUS | Status: DC | PRN
  Administered 2015-10-21: 180 mg via INTRAVENOUS

## 2015-10-21 MED ORDER — LIDOCAINE HCL (CARDIAC) 20 MG/ML IV SOLN
INTRAVENOUS | Status: DC | PRN
  Administered 2015-10-21: 70 mg via INTRAVENOUS

## 2015-10-21 MED ORDER — CLINDAMYCIN PHOSPHATE 600 MG/50ML IV SOLN
600.0000 mg | Freq: Four times a day (QID) | INTRAVENOUS | Status: AC
  Administered 2015-10-21 – 2015-10-22 (×3): 600 mg via INTRAVENOUS
  Filled 2015-10-21 (×4): qty 50

## 2015-10-21 MED ORDER — ONDANSETRON HCL 4 MG/2ML IJ SOLN
INTRAMUSCULAR | Status: AC
Start: 2015-10-21 — End: 2015-10-21
  Administered 2015-10-21: 4 mg via INTRAVENOUS
  Filled 2015-10-21: qty 2

## 2015-10-21 MED ORDER — MORPHINE SULFATE (PF) 2 MG/ML IV SOLN
2.0000 mg | INTRAVENOUS | Status: DC | PRN
  Administered 2015-10-22: 4 mg via INTRAVENOUS
  Administered 2015-10-22: 2 mg via INTRAVENOUS
  Filled 2015-10-21: qty 2
  Filled 2015-10-21: qty 1

## 2015-10-21 MED ORDER — ACETAMINOPHEN 650 MG RE SUPP: 650.0000 mg | Freq: Four times a day (QID) | RECTAL | Status: DC | PRN

## 2015-10-21 MED ORDER — LORAZEPAM 2 MG/ML IJ SOLN
INTRAMUSCULAR | Status: AC
  Filled 2015-10-21: qty 1

## 2015-10-21 MED ORDER — ONDANSETRON HCL 4 MG PO TABS
4.0000 mg | ORAL_TABLET | Freq: Four times a day (QID) | ORAL | Status: DC | PRN
  Administered 2015-10-22: 4 mg via ORAL
  Filled 2015-10-21: qty 1

## 2015-10-21 MED ORDER — PHENOL 1.4 % MT LIQD: 1.0000 | OROMUCOSAL | Status: DC | PRN

## 2015-10-21 MED ORDER — ONDANSETRON HCL 4 MG/2ML IJ SOLN
INTRAMUSCULAR | Status: AC
  Filled 2015-10-21: qty 2

## 2015-10-21 MED ORDER — OXYBUTYNIN CHLORIDE ER 15 MG PO TB24
15.0000 mg | ORAL_TABLET | Freq: Every day | ORAL | Status: DC
  Administered 2015-10-21 – 2015-10-23 (×3): 15 mg via ORAL
  Filled 2015-10-21 (×4): qty 1

## 2015-10-21 MED ORDER — MIDAZOLAM HCL 2 MG/2ML IJ SOLN
INTRAMUSCULAR | Status: AC
  Filled 2015-10-21: qty 2

## 2015-10-21 MED ORDER — DOCUSATE SODIUM 100 MG PO CAPS
100.0000 mg | ORAL_CAPSULE | Freq: Two times a day (BID) | ORAL | Status: DC
  Administered 2015-10-21 – 2015-10-24 (×6): 100 mg via ORAL
  Filled 2015-10-21 (×7): qty 1

## 2015-10-21 MED ORDER — ONDANSETRON HCL 4 MG/2ML IJ SOLN
4.0000 mg | Freq: Once | INTRAMUSCULAR | Status: AC | PRN
  Administered 2015-10-21: 4 mg via INTRAVENOUS

## 2015-10-21 MED ORDER — ACETAMINOPHEN 500 MG PO TABS: 1000.0000 mg | ORAL_TABLET | Freq: Three times a day (TID) | ORAL | Status: DC | PRN

## 2015-10-21 MED ORDER — NAPROXEN 250 MG PO TABS
250.0000 mg | ORAL_TABLET | Freq: Two times a day (BID) | ORAL | Status: DC
  Administered 2015-10-22 – 2015-10-24 (×6): 250 mg via ORAL
  Filled 2015-10-21 (×6): qty 1

## 2015-10-21 MED ORDER — THROMBIN 5000 UNITS EX SOLR
CUTANEOUS | Status: DC | PRN
  Administered 2015-10-21: 5000 [IU] via TOPICAL

## 2015-10-21 MED ORDER — FENTANYL CITRATE (PF) 100 MCG/2ML IJ SOLN
INTRAMUSCULAR | Status: AC
  Filled 2015-10-21: qty 2

## 2015-10-21 MED ORDER — MENTHOL 3 MG MT LOZG: 1.0000 | LOZENGE | OROMUCOSAL | Status: DC | PRN

## 2015-10-21 MED ORDER — ACETAMINOPHEN 325 MG PO TABS: 650.0000 mg | ORAL_TABLET | Freq: Four times a day (QID) | ORAL | Status: DC | PRN

## 2015-10-21 MED ORDER — PANTOPRAZOLE SODIUM 40 MG PO TBEC
40.0000 mg | DELAYED_RELEASE_TABLET | Freq: Every day | ORAL | Status: DC
  Administered 2015-10-22 – 2015-10-24 (×3): 40 mg via ORAL
  Filled 2015-10-21 (×3): qty 1

## 2015-10-21 MED ORDER — DEXTROSE 5 % IV SOLN
10.0000 mg | INTRAVENOUS | Status: DC | PRN
  Administered 2015-10-21: 15 ug/min via INTRAVENOUS

## 2015-10-21 MED ORDER — VANCOMYCIN HCL IN DEXTROSE 1-5 GM/200ML-% IV SOLN
1000.0000 mg | Freq: Once | INTRAVENOUS | Status: AC
  Administered 2015-10-21: 1000 mg via INTRAVENOUS

## 2015-10-21 MED ORDER — ROCURONIUM BROMIDE 100 MG/10ML IV SOLN
INTRAVENOUS | Status: DC | PRN
  Administered 2015-10-21: 50 mg via INTRAVENOUS

## 2015-10-21 MED ORDER — THROMBIN 20000 UNITS EX SOLR
CUTANEOUS | Status: AC
  Filled 2015-10-21: qty 20000

## 2015-10-21 MED ORDER — BUPIVACAINE-EPINEPHRINE 0.25% -1:200000 IJ SOLN
INTRAMUSCULAR | Status: DC | PRN
  Administered 2015-10-21: 10 mL

## 2015-10-21 MED ORDER — PROPOFOL 10 MG/ML IV BOLUS
INTRAVENOUS | Status: DC | PRN
  Administered 2015-10-21: 150 mg via INTRAVENOUS

## 2015-10-21 MED ORDER — 0.9 % SODIUM CHLORIDE (POUR BTL) OPTIME
TOPICAL | Status: DC | PRN
  Administered 2015-10-21: 1000 mL

## 2015-10-21 MED ORDER — LACTATED RINGERS IV SOLN
INTRAVENOUS | Status: DC
  Administered 2015-10-21 (×2): via INTRAVENOUS

## 2015-10-21 MED ORDER — METOCLOPRAMIDE HCL 5 MG/ML IJ SOLN: 5.0000 mg | Freq: Three times a day (TID) | INTRAMUSCULAR | Status: DC | PRN

## 2015-10-21 MED ORDER — MIDAZOLAM HCL 5 MG/5ML IJ SOLN
INTRAMUSCULAR | Status: DC | PRN
  Administered 2015-10-21: 2 mg via INTRAVENOUS

## 2015-10-21 MED ORDER — METOCLOPRAMIDE HCL 5 MG PO TABS: 5.0000 mg | ORAL_TABLET | Freq: Three times a day (TID) | ORAL | Status: DC | PRN

## 2015-10-21 MED ORDER — ONDANSETRON HCL 4 MG/2ML IJ SOLN
4.0000 mg | Freq: Four times a day (QID) | INTRAMUSCULAR | Status: DC | PRN
  Administered 2015-10-21: 4 mg via INTRAVENOUS
  Filled 2015-10-21 (×2): qty 2

## 2015-10-21 MED ORDER — OXYCODONE HCL 5 MG PO TABS
5.0000 mg | ORAL_TABLET | ORAL | Status: DC | PRN
  Administered 2015-10-21 – 2015-10-24 (×12): 10 mg via ORAL
  Filled 2015-10-21 (×13): qty 2

## 2015-10-21 MED ORDER — FENTANYL CITRATE (PF) 100 MCG/2ML IJ SOLN
50.0000 ug | Freq: Once | INTRAMUSCULAR | Status: AC
  Administered 2015-10-21: 50 ug via INTRAVENOUS

## 2015-10-21 MED ORDER — AMLODIPINE BESYLATE 10 MG PO TABS
10.0000 mg | ORAL_TABLET | Freq: Every day | ORAL | Status: DC
  Administered 2015-10-22: 10 mg via ORAL
  Filled 2015-10-21: qty 1

## 2015-10-21 MED ORDER — SUGAMMADEX SODIUM 200 MG/2ML IV SOLN
INTRAVENOUS | Status: AC
Start: 2015-10-21 — End: 2015-10-21
  Filled 2015-10-21: qty 2

## 2015-10-21 MED ORDER — BUPIVACAINE-EPINEPHRINE (PF) 0.5% -1:200000 IJ SOLN
INTRAMUSCULAR | Status: DC | PRN
  Administered 2015-10-21: 30 mL via PERINEURAL

## 2015-10-21 MED ORDER — POLYETHYLENE GLYCOL 3350 17 G PO PACK
17.0000 g | PACK | Freq: Every day | ORAL | Status: DC | PRN
  Administered 2015-10-22: 17 g via ORAL
  Filled 2015-10-21: qty 1

## 2015-10-21 MED ORDER — BIOTIN 5000 MCG PO TABS: 1.0000 | ORAL_TABLET | Freq: Every day | ORAL | Status: DC

## 2015-10-21 MED ORDER — NAPROXEN SODIUM 220 MG PO TABS: 220.0000 mg | ORAL_TABLET | Freq: Two times a day (BID) | ORAL | Status: DC

## 2015-10-21 MED ORDER — LORAZEPAM 2 MG/ML IJ SOLN
0.5000 mg | INTRAMUSCULAR | Status: AC
  Administered 2015-10-21: 0.5 mg via INTRAVENOUS

## 2015-10-21 SURGICAL SUPPLY — 78 items
BLADE SAW SAG 73X25 THK (BLADE) ×2
BLADE SAW SGTL 73X25 THK (BLADE) ×1 IMPLANT
BUR SURG 4X8 MED (BURR) IMPLANT
BURR SURG 4MMX8MM MEDIUM (BURR)
BURR SURG 4X8 MED (BURR)
CAPT SHLDR TOTAL 2 ×2 IMPLANT
CEMENT BONE DEPUY (Cement) ×3 IMPLANT
CLOSURE WOUND 1/2 X4 (GAUZE/BANDAGES/DRESSINGS) ×1
COVER SURGICAL LIGHT HANDLE (MISCELLANEOUS) ×3 IMPLANT
DRAPE IMP U-DRAPE 54X76 (DRAPES) ×3 IMPLANT
DRAPE INCISE IOBAN 66X45 STRL (DRAPES) ×9 IMPLANT
DRAPE U-SHAPE 47X51 STRL (DRAPES) ×3 IMPLANT
DRAPE X-RAY CASS 24X20 (DRAPES) IMPLANT
DRILL BIT 5/64 (BIT) ×3 IMPLANT
DRSG ADAPTIC 3X8 NADH LF (GAUZE/BANDAGES/DRESSINGS) ×3 IMPLANT
DRSG PAD ABDOMINAL 8X10 ST (GAUZE/BANDAGES/DRESSINGS) ×4 IMPLANT
DURAPREP 26ML APPLICATOR (WOUND CARE) ×3 IMPLANT
ELECT BLADE 4.0 EZ CLEAN MEGAD (MISCELLANEOUS) ×3
ELECT NEEDLE TIP 2.8 STRL (NEEDLE) ×3 IMPLANT
ELECT REM PT RETURN 9FT ADLT (ELECTROSURGICAL) ×3
ELECTRODE BLDE 4.0 EZ CLN MEGD (MISCELLANEOUS) ×1 IMPLANT
ELECTRODE REM PT RTRN 9FT ADLT (ELECTROSURGICAL) ×1 IMPLANT
GAUZE SPONGE 4X4 12PLY STRL (GAUZE/BANDAGES/DRESSINGS) ×3 IMPLANT
GLOVE BIO SURGEON STRL SZ 6.5 (GLOVE) ×1 IMPLANT
GLOVE BIO SURGEONS STRL SZ 6.5 (GLOVE) ×1
GLOVE BIOGEL PI IND STRL 6.5 (GLOVE) ×1 IMPLANT
GLOVE BIOGEL PI IND STRL 7.0 (GLOVE) IMPLANT
GLOVE BIOGEL PI INDICATOR 6.5 (GLOVE) ×2
GLOVE BIOGEL PI INDICATOR 7.0 (GLOVE) ×2
GLOVE BIOGEL PI ORTHO PRO 7.5 (GLOVE) ×2
GLOVE BIOGEL PI ORTHO PRO SZ8 (GLOVE) ×2
GLOVE ORTHO TXT STRL SZ7.5 (GLOVE) ×3 IMPLANT
GLOVE PI ORTHO PRO STRL 7.5 (GLOVE) ×1 IMPLANT
GLOVE PI ORTHO PRO STRL SZ8 (GLOVE) ×1 IMPLANT
GLOVE SURG ORTHO 8.5 STRL (GLOVE) ×6 IMPLANT
GOWN STRL REUS W/ TWL XL LVL3 (GOWN DISPOSABLE) ×3 IMPLANT
GOWN STRL REUS W/TWL XL LVL3 (GOWN DISPOSABLE) ×9
HANDPIECE INTERPULSE COAX TIP (DISPOSABLE)
KIT BASIN OR (CUSTOM PROCEDURE TRAY) ×3 IMPLANT
KIT ROOM TURNOVER OR (KITS) ×3 IMPLANT
MANIFOLD NEPTUNE II (INSTRUMENTS) ×3 IMPLANT
NDL 1/2 CIR MAYO (NEEDLE) ×1 IMPLANT
NDL HYPO 25GX1X1/2 BEV (NEEDLE) ×1 IMPLANT
NDL SUT 6 .5 CRC .975X.05 MAYO (NEEDLE) ×1 IMPLANT
NEEDLE 1/2 CIR MAYO (NEEDLE) ×3 IMPLANT
NEEDLE HYPO 25GX1X1/2 BEV (NEEDLE) ×3 IMPLANT
NEEDLE MAYO TAPER (NEEDLE) ×3
NS IRRIG 1000ML POUR BTL (IV SOLUTION) ×3 IMPLANT
PACK SHOULDER (CUSTOM PROCEDURE TRAY) ×3 IMPLANT
PACK UNIVERSAL I (CUSTOM PROCEDURE TRAY) ×3 IMPLANT
PAD ARMBOARD 7.5X6 YLW CONV (MISCELLANEOUS) ×6 IMPLANT
PIN METAGLENE 2.5 (PIN) ×4 IMPLANT
SET HNDPC FAN SPRY TIP SCT (DISPOSABLE) IMPLANT
SLING ARM FOAM STRAP LRG (SOFTGOODS) ×3 IMPLANT
SLING ARM IMMOBILIZER LRG (SOFTGOODS) ×3 IMPLANT
SLING ARM IMMOBILIZER MED (SOFTGOODS) IMPLANT
SMARTMIX MINI TOWER (MISCELLANEOUS) ×3
SPONGE LAP 18X18 X RAY DECT (DISPOSABLE) ×3 IMPLANT
SPONGE LAP 4X18 X RAY DECT (DISPOSABLE) ×3 IMPLANT
SPONGE SURGIFOAM ABS GEL SZ50 (HEMOSTASIS) IMPLANT
STRIP CLOSURE SKIN 1/2X4 (GAUZE/BANDAGES/DRESSINGS) ×2 IMPLANT
SUCTION FRAZIER TIP 10 FR DISP (SUCTIONS) ×3 IMPLANT
SUT FIBERWIRE #2 38 T-5 BLUE (SUTURE) ×6
SUT MNCRL AB 4-0 PS2 18 (SUTURE) ×3 IMPLANT
SUT VIC AB 0 CT1 27 (SUTURE) ×3
SUT VIC AB 0 CT1 27XBRD ANBCTR (SUTURE) ×1 IMPLANT
SUT VIC AB 2-0 CT1 27 (SUTURE) ×3
SUT VIC AB 2-0 CT1 TAPERPNT 27 (SUTURE) ×1 IMPLANT
SUT VICRYL AB 2 0 TIES (SUTURE) ×3 IMPLANT
SUTURE FIBERWR #2 38 T-5 BLUE (SUTURE) ×2 IMPLANT
SYR CONTROL 10ML LL (SYRINGE) ×3 IMPLANT
TAPE CLOTH SURG 6X10 WHT LF (GAUZE/BANDAGES/DRESSINGS) ×2 IMPLANT
TOWEL OR 17X24 6PK STRL BLUE (TOWEL DISPOSABLE) ×3 IMPLANT
TOWEL OR 17X26 10 PK STRL BLUE (TOWEL DISPOSABLE) ×3 IMPLANT
TOWER SMARTMIX MINI (MISCELLANEOUS) ×1 IMPLANT
TRAY FOLEY CATH 16FRSI W/METER (SET/KITS/TRAYS/PACK) ×3 IMPLANT
WATER STERILE IRR 1000ML POUR (IV SOLUTION) ×3 IMPLANT
YANKAUER SUCT BULB TIP NO VENT (SUCTIONS) IMPLANT

## 2015-10-21 NOTE — Interval H&P Note (Signed)
History and Physical Interval Note:  10/21/2015 10:01 AM  Dawn Salinas  has presented today for surgery, with the diagnosis of LEFT SHOULDER OA  The various methods of treatment have been discussed with the patient and family. After consideration of risks, benefits and other options for treatment, the patient has consented to  Procedure(s): TOTAL SHOULDER ARTHROPLASTY (Left) as a surgical intervention .  The patient's history has been reviewed, patient examined, no change in status, stable for surgery.  I have reviewed the patient's chart and labs.  Questions were answered to the patient's satisfaction.     Cledis Sohn,STEVEN R

## 2015-10-21 NOTE — Brief Op Note (Signed)
10/21/2015  12:46 PM  PATIENT:  Dawn Salinas  70 y.o. female  PRE-OPERATIVE DIAGNOSIS:  LEFT SHOULDER OA, END STAGE  POST-OPERATIVE DIAGNOSIS:  LEFT SHOULDER OA,END STAGE  PROCEDURE:  Procedure(s): TOTAL SHOULDER ARTHROPLASTY (Left) [employer], APG glenoid  SURGEON:  Surgeon(s) and Role:    * Netta Cedars, MD - Primary  PHYSICIAN ASSISTANT:   ASSISTANTS: Ventura Bruns, PA-C   ANESTHESIA:   regional and general  EBL:  Total I/O In: 1000 [I.V.:1000] Out: -   BLOOD ADMINISTERED:none  DRAINS: none   LOCAL MEDICATIONS USED:  MARCAINE     SPECIMEN:  No Specimen  DISPOSITION OF SPECIMEN:  N/A  COUNTS:  YES  TOURNIQUET:  * No tourniquets in log *  DICTATION: .Other Dictation: Dictation Number [id number]  PLAN OF CARE: Admit to inpatient   PATIENT DISPOSITION:  PACU - hemodynamically stable.   Delay start of Pharmacological VTE agent (>24hrs) due to surgical blood loss or risk of bleeding: yes

## 2015-10-21 NOTE — Anesthesia Preprocedure Evaluation (Signed)
Anesthesia Evaluation  Patient identified by MRN, date of birth, ID band Patient awake    Reviewed: Allergy & Precautions, NPO status , Patient's Chart, lab work & pertinent test results  Airway Mallampati: I  TM Distance: >3 FB Neck ROM: Full    Dental   Pulmonary    Pulmonary exam normal        Cardiovascular hypertension, Pt. on medications Normal cardiovascular exam     Neuro/Psych    GI/Hepatic GERD  Medicated and Controlled,  Endo/Other    Renal/GU      Musculoskeletal   Abdominal   Peds  Hematology   Anesthesia Other Findings   Reproductive/Obstetrics                             Anesthesia Physical Anesthesia Plan  ASA: II  Anesthesia Plan: General   Post-op Pain Management: GA combined w/ Regional for post-op pain   Induction: Intravenous  Airway Management Planned: Oral ETT  Additional Equipment:   Intra-op Plan:   Post-operative Plan: Extubation in OR  Informed Consent: I have reviewed the patients History and Physical, chart, labs and discussed the procedure including the risks, benefits and alternatives for the proposed anesthesia with the patient or authorized representative who has indicated his/her understanding and acceptance.     Plan Discussed with: CRNA and Surgeon  Anesthesia Plan Comments:         Anesthesia Quick Evaluation

## 2015-10-21 NOTE — Progress Notes (Signed)
Utilization review completed.  

## 2015-10-21 NOTE — Anesthesia Procedure Notes (Addendum)
Anesthesia Regional Block:  Interscalene brachial plexus block  Pre-Anesthetic Checklist: ,, timeout performed, Correct Patient, Correct Site, Correct Laterality, Correct Procedure, Correct Position, site marked, Risks and benefits discussed,  Surgical consent,  Pre-op evaluation,  At surgeon's request and post-op pain management  Laterality: Left  Prep: chloraprep       Needles:  Injection technique: Single-shot  Needle Type: Echogenic Stimulator Needle     Needle Length: 9cm 9 cm Needle Gauge: 21 and 21 G    Additional Needles:  Procedures: ultrasound guided (picture in chart) and nerve stimulator Interscalene brachial plexus block  Nerve Stimulator or Paresthesia:  Response: 0.4 mA,   Additional Responses:   Narrative:  Start time: 10/21/2015 9:20 AM End time: 10/21/2015 9:30 AM Injection made incrementally with aspirations every 5 mL.  Performed by: Personally  Anesthesiologist: Lillia Abed  Additional Notes: Monitors applied. Patient sedated. Sterile prep and drape,hand hygiene and sterile gloves were used. Relevant anatomy identified.Needle position confirmed.Local anesthetic injected incrementally after negative aspiration. Local anesthetic spread visualized around nerve(s). Vascular puncture avoided. No complications. Image printed for medical record.The patient tolerated the procedure well.        Procedure Name: Intubation Date/Time: 10/21/2015 10:29 AM Performed by: Tamala Fothergill S Patient Re-evaluated:Patient Re-evaluated prior to inductionOxygen Delivery Method: Circle system utilized Preoxygenation: Pre-oxygenation with 100% oxygen Intubation Type: IV induction Ventilation: Mask ventilation without difficulty Laryngoscope Size: Miller and 2 Grade View: Grade I Tube type: Oral Tube size: 7.5 mm Number of attempts: 1 Placement Confirmation: ETT inserted through vocal cords under direct vision,  breath sounds checked- equal and bilateral and positive  ETCO2 Tube secured with: Tape Dental Injury: Teeth and Oropharynx as per pre-operative assessment

## 2015-10-21 NOTE — Op Note (Signed)
Dawn Salinas, Dawn Salinas                 ACCOUNT NO.:  [id number]  MEDICAL RECORD NO.:  [id number]  LOCATION:  [facility]                         FACILITY:  [facility]  PHYSICIAN:  Doran Heater. Veverly Fells, M.D. DATE OF BIRTH:  [birth year]  DATE OF PROCEDURE:  10/21/2015 DATE OF DISCHARGE:                              OPERATIVE REPORT   PREOPERATIVE DIAGNOSIS:  Left shoulder end-stage arthritis.  POSTOPERATIVE DIAGNOSIS:  Left shoulder end-stage arthritis.  PROCEDURE PERFORMED:  Left total shoulder arthroplasty, using DePuy Global Unite APG glenoid.  ATTENDING SURGEON:  Doran Heater. Veverly Fells, M.D.  ASSISTANT:  Charletta Cousin Dixon, [place], who has scrubbed the entire procedure and necessary for satisfactory completion of surgery.  ANESTHESIA:  General anesthesia was used plus interscalene block.  ESTIMATED BLOOD LOSS:  Minimal.  FLUID REPLACEMENT:  1200 mL crystalloids.  INSTRUMENT COUNTS:  Correct.  COMPLICATIONS:  There were no complications.  ANTIBIOTICS:  Perioperative antibiotics were given.  INDICATIONS:  The patient is a 70 year old female with worsening left shoulder pain, secondary to end-stage arthritis.  The patient has had progressive pain in the last several years.  Despite aggressive conservative management, the patient presents now for operative treatment to restore function and eliminate pain to the shoulder. Informed consent obtained.  DESCRIPTION OF PROCEDURE:  After an adequate level of anesthesia achieved, the patient was positioned in the modified beach-chair position.  Left shoulder correctly identified and sterilely prepped and draped in the usual manner.  Time-out was called.  We initiated the surgery through a deltopectoral incision, starting at the coracoid process, extending down to the anterior humerus.  Dissection down through subcutaneous tissues using Bovie identified cephalic vein, took it laterally with the deltoid, pectoralis taken medially.  Conjoined tendon  identified and retracted medially.  Deep retractors placed. Subscapularis peeled off subperiosteally off the lesser tuberosity and tagged for repair at the end.  The capsule was removed off the inferior neck of the humerus with progressive external rotation.  A T-handled Crego elevator was placed over the top of the humeral head at the rotator cuff junction and then a large Crego, wide Crego was placed inferiorly to protect the axillary contents.  We then placed the elbow at the patient's side 20-25 degrees of external rotation and then performed our neck cut with a neck resection guide.  Once the neck cut was made, we removed the remaining osteophytes off the humerus.  We then prepared the humerus with sequential reaming up to a size 10. We then punched for the 8 and then the 10 with her humeral punches and then placed our trial stem in place and this was a Global Unite stem with a 10 body and a 10 metaphysis placed in about 25 degrees of retroversion. At this point, we removed the trial stem.  We subluxed the humerus posteriorly.  Performed a biceps tenodesis and tenotomy.  We then removed the glenoid labrum and did our releases.  We had excellent exposure of the glenoid.  We identified the 6 o'clock position in the scapular neck.  We then went ahead and placed our glenoid neck retractors and removed the remaining cartilage with a Cobb elevator.  We placed a guide pin  central along the glenoid face.  Sized it to a size 40 glenoid.  We then reamed with a central reamer and our peripheral hand reamer, drilled the central peg hole and then drilled our 3 peripheral holes for the APG glenoid.  We then placed our trial glenoid to make sure it seated fully, which it did.  We irrigated thoroughly, placed epi soaked Gelfoam in the 3 peripheral holes and then mixed DePuy 1 cement on the back table and pressurized the cement into the 3 peripheral holes.  We impacted the APG glenoid in place.  Held  pressure on that until the cement hardened on the back table.  Once it was hardened, we checked the security of the glenoid and it was secure and then went ahead and translated the humerus back anteriorly.  We then placed drill holes in the lesser tuberosity.  A #2 FiberWire suture for repair of the subscapularis and we then went ahead and used available bone graft from the humeral head and did an impaction grafting technique with the real stem for the Global Unite 10 and 10 body and 10 passes. Once that was impacted into position with appropriate version, we trailed with a 44 eccentric by 18 mm thickness, dialed the eccentricity superiorly and posteriorly.  Had excellent coverage in outstanding stability with 50% translation posteriorly, anteriorly, and inferiorly. We removed our trial head placed a real head in position, impacted that on the trunnion in place.  We then reduced the shoulder, did this anatomic subscapularis repair direct with the bone as well as rotator interval repair.  We were able to get 35-40 degrees of external rotation.  No problem with portal elevation up to 140, with excellent stability.  We then irrigated thoroughly with a pulse irrigator and then repaired the deltopectoral interval with 0 Vicryl suture and then 2-0 Vicryl subcutaneous closure, and 4-0 Monocryl for skin.  Steri-Strips applied followed by sterile dressing.  The patient tolerated the surgery well.     Doran Heater. Veverly Fells, M.D.     SRN/MEDQ  D:  10/21/2015  T:  10/21/2015  Job:  [id number]

## 2015-10-21 NOTE — Transfer of Care (Signed)
Immediate Anesthesia Transfer of Care Note  Patient: Dawn Salinas  Procedure(s) Performed: Procedure(s): TOTAL SHOULDER ARTHROPLASTY (Left)  Patient Location: PACU  Anesthesia Type:General  Level of Consciousness: awake, alert  and oriented  Airway & Oxygen Therapy: Patient Spontanous Breathing and Patient connected to nasal cannula oxygen  Post-op Assessment: Report given to RN and Post -op Vital signs reviewed and stable  Post vital signs: Reviewed and stable  Last Vitals:  Filed Vitals:   10/21/15 1300 10/21/15 1303  BP: 116/54   Pulse:    Temp:  36.3 C  Resp: 8     Complications: No apparent anesthesia complications

## 2015-10-21 NOTE — Discharge Instructions (Signed)
Ice to the shoulder as much as you can. Do exercises every hour while awake  Ok to use the left arm for gentle activity of daily living. Limit weight bearing, no pushing out of a chair.  Use the sling while up walking around, ok to remove while seated or laying in bed (hug a pillow instead)  Keep the incision covered and clean and dry for one week then ok to shower and get wet.   Follow up in two weeks in the office [phone]

## 2015-10-22 DIAGNOSIS — M19012 Primary osteoarthritis, left shoulder: Secondary | ICD-10-CM | POA: Diagnosis not present

## 2015-10-22 LAB — BASIC METABOLIC PANEL
ANION GAP: 9 (ref 5–15)
BUN: 10 mg/dL (ref 6–20)
CHLORIDE: 105 mmol/L (ref 101–111)
CO2: 25 mmol/L (ref 22–32)
Calcium: 8.1 mg/dL — ABNORMAL LOW (ref 8.9–10.3)
Creatinine, Ser: 0.78 mg/dL (ref 0.44–1.00)
Glucose, Bld: 131 mg/dL — ABNORMAL HIGH (ref 65–99)
POTASSIUM: 3.9 mmol/L (ref 3.5–5.1)
Sodium: 139 mmol/L (ref 135–145)

## 2015-10-22 LAB — HEMOGLOBIN AND HEMATOCRIT, BLOOD
HCT: 34 % — ABNORMAL LOW (ref 36.0–46.0)
Hemoglobin: 11.2 g/dL — ABNORMAL LOW (ref 12.0–15.0)

## 2015-10-22 MED ORDER — DIPHENHYDRAMINE HCL 25 MG PO CAPS
25.0000 mg | ORAL_CAPSULE | Freq: Four times a day (QID) | ORAL | Status: DC | PRN
  Administered 2015-10-22 – 2015-10-24 (×2): 25 mg via ORAL
  Filled 2015-10-22 (×2): qty 1

## 2015-10-22 NOTE — Progress Notes (Signed)
Benadryl 25 mg PO administered at this time. Patient indicated that, she usually takes 2 tablets at home and not one but decline for this writer to call the on-call back to ask for two tablets which is 50 mg. Patient also indicted un-happiness about the length of time it took for her to get a non-narcortic medication.

## 2015-10-22 NOTE — Progress Notes (Signed)
     Subjective: 1 Day Post-Op Procedure(s) (LRB): TOTAL SHOULDER ARTHROPLASTY (Left)   Patient reports pain as moderate, pain controlled with medication.  She feels that she is still having a lot of difficulty with getting up and getting to the bathroom.  Not sleeping well.    Objective:   VITALS:   Filed Vitals:   10/22/15 0049 10/22/15 0500  BP: 130/93 109/68  Pulse: 90 81  Temp: 99.4 F (37.4 C) 98.1 F (36.7 C)  Resp: 16     Incision: dressing C/D/I No cellulitis present Compartment soft  LABS  Recent Labs  10/22/15 0334  HGB 11.2*  HCT 34.0*     Recent Labs  10/22/15 0334  NA 139  K 3.9  BUN 10  CREATININE 0.78  GLUCOSE 131*     Assessment/Plan: 1 Day Post-Op Procedure(s) (LRB): TOTAL SHOULDER ARTHROPLASTY (Left)  Advance diet Up with therapy D/C IV fluids Discharge home eventually when ready, possible tomorrow     West Pugh. Reylynn Vanalstine   PAC  10/22/2015, 8:12 AM

## 2015-10-22 NOTE — Progress Notes (Signed)
Patient complained of itching. [facility] on-call services and Danae Orleans PA-C returned call. He ordered Benadryl 25 mg PO Q 6 PRN for itching. Order was read back and confirmed.

## 2015-10-22 NOTE — Evaluation (Addendum)
Occupational Therapy Evaluation Patient Details Name: Dawn Salinas MRN: [id number] DOB: 02/11/1946 Today's Date: 10/22/2015    History of Present Illness 70 y.o. s/p left TSA. PMH includes hypertension, hyperlipidemia, GERD, hip pain, TKA, and left shoulder pain, and neck surgery.   Clinical Impression   Pt s/p above. Pt independent with ADLs, PTA. Feel pt will benefit from acute OT to increase independence and address LUE prior to d/c. Recommending HHOT and 24/7 assist (at least initially).     Follow Up Recommendations  Home health OT;Supervision/Assistance - 24 hour (at least initially)   Equipment Recommendations  3 in 1 bedside comode    Recommendations for Other Services PT consult     Precautions / Restrictions Precautions Precautions: Shoulder;Fall Type of Shoulder Precautions: active protocol-AROM/PROM forward flexion-90 degrees, AROM/PROM abduction-60 degrees, AROM/PROM external rotation-30 degrees; no pushing, pulling, lifting with LUE (can use to hold light items) Shoulder Interventions: Shoulder sling/immobilizer;For comfort (and sleep) Precaution Booklet Issued: Yes (comment) Precaution Comments: educated on shoulder precautions Required Braces or Orthoses: Sling (for comfort and sleep) Restrictions Weight Bearing Restrictions: Yes LUE Weight Bearing: Non weight bearing      Mobility Bed Mobility Overal bed mobility: Needs Assistance Bed Mobility: Sit to Supine;Supine to Sit     Supine to sit: Supervision Sit to supine: Supervision      Transfers Overall transfer level: Needs assistance   Transfers: Sit to/from Stand Sit to Stand: Min guard              Balance    Min guard for ambulation. Pt reaching for support with single leg stance. History of falls.                                         ADL Overall ADL's : Needs assistance/impaired Eating/Feeding: Sitting;Modified independent                   Lower Body  Dressing: Minimal assistance;Sit to/from stand   Toilet Transfer: Min guard;Ambulation;BSC   Toileting- Clothing Manipulation and Hygiene: Minimal assistance;Sit to/from stand       Functional mobility during ADLs: Min guard General ADL Comments: Discussed tips for managing ADLs with LUE. Educated on tub transfer techniques and options for shower chair. Discussed safety. Encouraged pt to be using LUE functionally.     Vision     Perception     Praxis      Pertinent Vitals/Pain Pain Assessment: 0-10 Pain Score: 6  Pain Location: LUE Pain Descriptors / Indicators: Burning Pain Intervention(s): Monitored during session;Limited activity within patient's tolerance;Repositioned     Hand Dominance     Extremity/Trunk Assessment Upper Extremity Assessment Upper Extremity Assessment: LUE deficits/detail LUE Deficits / Details: s/p left TSA; painful with shoulder ROM   Lower Extremity Assessment Lower Extremity Assessment: Defer to PT evaluation       Communication Communication Communication: No difficulties   Cognition Arousal/Alertness: Awake/alert Behavior During Therapy: WFL for tasks assessed/performed Overall Cognitive Status: Within Functional Limits for tasks assessed                     General Comments       Exercises Exercises: Shoulder;Other exercises Other Exercises Other Exercises: Pt performed Lt shoulder External/Internal rotation when in bed (AAROM) and left shoulder forward flexion in bed (AAROM); also performed left elbow, wrist, hand AROM   Shoulder Instructions Shoulder Instructions  Donning/doffing shirt without moving shoulder:  (educated on technique and discussed clothing; pt allowed to move left shoulder within guidelines) Method for sponge bathing under operated UE:  (educated on technique) Donning/doffing sling/immobilizer:  (educated on technique) Correct positioning of sling/immobilizer:  (educated) ROM for elbow, wrist and digits  of operated UE: Supervision/safety Sling wearing schedule (on at all times/off for ADL's):  (educated for comfort and sleep) Proper positioning of operated UE when showering:  (educated on technique) Positioning of UE while sleeping:  (educated)    Home Living Family/patient expects to be discharged to:: Unsure Living Arrangements: Alone Available Help at Discharge:  (thinks her neighbors may can help her intermittently) Type of Home:  (townhome) Home Access: Level entry     Home Layout: One level     Bathroom Shower/Tub: Teacher, early years/pre: Standard                Prior Functioning/Environment Level of Independence: Independent             OT Diagnosis: Acute pain   OT Problem List: Decreased range of motion;Pain;Impaired UE functional use;Decreased knowledge of precautions;Decreased knowledge of use of DME or AE;Impaired balance (sitting and/or standing);Decreased activity tolerance   OT Treatment/Interventions: Self-care/ADL training;DME and/or AE instruction;Therapeutic activities;Patient/family education;Balance training;Therapeutic exercise    OT Goals(Current goals can be found in the care plan section) Acute Rehab OT Goals Patient Stated Goal: to get shoulder in shape and body in shape OT Goal Formulation: With patient Time For Goal Achievement: 10/29/15 Potential to Achieve Goals: Good ADL Goals Pt Will Perform Lower Body Dressing: with set-up;sit to/from stand Pt Will Transfer to Toilet: ambulating;with set-up;bedside commode Pt Will Perform Toileting - Clothing Manipulation and hygiene: sit to/from stand;with modified independence Pt Will Perform Tub/Shower Transfer: Tub transfer;ambulating;with set-up;with supervision (shower DME TBD) Additional ADL Goal #1: Pt will perform HEP for LUE independently, using handout as needed. Additional ADL Goal #2: Pt will don/doff sling with setup assist.  OT Frequency: Min 2X/week   Barriers to D/C:             Co-evaluation              End of Session Equipment Utilized During Treatment: Other (comment) (sling) Nurse Communication: Other (comment) (recommending HHOT; needs ice; PT consult)  Activity Tolerance: Patient limited by pain (but did well in session) Patient left: in bed;with call bell/phone within reach;with bed alarm set   Time: 1432-1511 OT Time Calculation (min): 39 min Charges:  OT General Charges $OT Visit: 1 Procedure OT Evaluation $OT Eval Moderate Complexity: 1 Procedure OT Treatments $Self Care/Home Management : 8-22 mins G-CodesBenito Mccreedy OTR/L [id number] 10/22/2015, 4:20 PM

## 2015-10-22 NOTE — Anesthesia Postprocedure Evaluation (Signed)
Anesthesia Post Note  Patient: Dawn Salinas  Procedure(s) Performed: Procedure(s) (LRB): TOTAL SHOULDER ARTHROPLASTY (Left)  Patient location during evaluation: PACU Anesthesia Type: General Level of consciousness: awake and alert Pain management: pain level controlled Vital Signs Assessment: post-procedure vital signs reviewed and stable Respiratory status: spontaneous breathing, nonlabored ventilation, respiratory function stable and patient connected to nasal cannula oxygen Cardiovascular status: blood pressure returned to baseline and stable Postop Assessment: no signs of nausea or vomiting Anesthetic complications: no    Last Vitals:  Filed Vitals:   10/22/15 0049 10/22/15 0500  BP: 130/93 109/68  Pulse: 90 81  Temp: 37.4 C 36.7 C  Resp: 16     Last Pain:  Filed Vitals:   10/22/15 0541  PainSc: 5                  Luisalberto Beegle DAVID

## 2015-10-23 MED ORDER — BENAZEPRIL HCL 20 MG PO TABS
20.0000 mg | ORAL_TABLET | Freq: Every day | ORAL | Status: DC
Start: 2015-10-23 — End: 2015-10-24
  Administered 2015-10-23 – 2015-10-24 (×2): 20 mg via ORAL
  Filled 2015-10-23 (×2): qty 1

## 2015-10-23 MED ORDER — AMLODIPINE BESYLATE 10 MG PO TABS
10.0000 mg | ORAL_TABLET | Freq: Every day | ORAL | Status: DC
  Administered 2015-10-23 – 2015-10-24 (×2): 10 mg via ORAL
  Filled 2015-10-23 (×2): qty 1

## 2015-10-23 NOTE — Progress Notes (Addendum)
Occupational Therapy Treatment Patient Details Name: Dawn Salinas MRN: [id number] DOB: June 04, 1946 Today's Date: 10/23/2015    History of present illness 70 y.o. s/p left TSA. PMH includes hypertension, hyperlipidemia, GERD, hip pain, TKA, and left shoulder pain, and neck surgery.   OT comments  Pt progressing. Education provided in session. Will continue to follow acutely.  Follow Up Recommendations  Home health OT;Supervision/Assistance - 24 hour    Equipment Recommendations  3 in 1 bedside comode    Recommendations for Other Services      Precautions / Restrictions Precautions Precautions: Shoulder;Fall Type of Shoulder Precautions: active protocol-AROM/PROM forward flexion-90 degrees, AROM/PROM abduction-60 degrees, AROM/PROM external rotation-30 degrees; no pushing, pulling, lifting with LUE (can use to hold light items) Shoulder Interventions: Shoulder sling/immobilizer;For comfort (and sleep) Precaution Booklet Issued:  (one in room) Precaution Comments: reviewed shoulder precautions Required Braces or Orthoses: Sling (for comfort and sleep) Restrictions Weight Bearing Restrictions: Yes LUE Weight Bearing: Non weight bearing       Mobility Bed Mobility Overal bed mobility: Modified Independent with use of rail                Transfers Overall transfer level: Needs assistance   Transfers: Sit to/from Stand Sit to Stand: Supervision              Balance    Supervision for ambulation.                               ADL Overall ADL's : Needs assistance/impaired   Eating/Feeding Details (indicate cue type and reason): drank water with no difficulty sitting Grooming: Wash/dry face;Oral care;Supervision/safety;Set up;Standing Grooming Details (indicate cue type and reason): encouraged pt to use LUE Upper Body Bathing: Supervision/ safety;Set up;Standing Upper Body Bathing Details (indicate cue type and reason): washed under left arm and  part of UB             Toilet Transfer: Supervision/safety;Ambulation (sit to stand from bed)           Functional mobility during ADLs: Supervision/safety General ADL Comments: Encouraged pt to be using LUE functionally.      Vision                     Perception     Praxis      Cognition  Awake/Alert Behavior During Therapy: WFL for tasks assessed/performed Overall Cognitive Status: Within Functional Limits for tasks assessed                       Extremity/Trunk Assessment               Exercises Other Exercises Other Exercises: Pt performed Lt shoulder External/Internal rotation when in bed (AAROM) and left shoulder forward flexion in bed (AAROM) as well as left shoulder abduction (AAROM)- approximately 10 reps each Other Exercises: Pt performed left elbow flexion/extension (AROM), wrist flexion/extension (AROM), and digit composite flexion/extension (AROM) Donning/doffing shirt without moving shoulder:  (reviewed technique of donning/doffing shirt) Method for sponge bathing under operated UE: Supervision/safety;Set-up Donning/doffing sling/immobilizer: Minimal assistance Correct positioning of sling/immobilizer: Minimal assistance ROM for elbow, wrist and digits of operated UE: Supervision/safety Sling wearing schedule (on at all times/off for ADL's):  (educated) Proper positioning of operated UE when showering:  (educated) Positioning of UE while sleeping:  (pt able to verbalize)   Shoulder Instructions Shoulder Instructions Donning/doffing shirt without moving shoulder:  (reviewed technique of  donning/doffing shirt) Method for sponge bathing under operated UE: Supervision/safety;Set-up Donning/doffing sling/immobilizer: Minimal assistance Correct positioning of sling/immobilizer: Minimal assistance ROM for elbow, wrist and digits of operated UE: Supervision/safety Sling wearing schedule (on at all times/off for ADL's):   (educated) Proper positioning of operated UE when showering:  (educated) Positioning of UE while sleeping:  (pt able to verbalize)     General Comments      Pertinent Vitals/ Pain       Pain Assessment: 0-10 Pain Score:  (10 getting out of bed and 3 after sitting up) Pain Location: LUE Pain Intervention(s): Monitored during session  Home Living                                          Prior Functioning/Environment              Frequency Min 2X/week     Progress Toward Goals  OT Goals(current goals can now be found in the care plan section)  Progress towards OT goals: Progressing toward goals-added a goal  Acute Rehab OT Goals Patient Stated Goal: not stated OT Goal Formulation: With patient Time For Goal Achievement: 10/29/15 Potential to Achieve Goals: Good ADL Goals Pt Will Perform Lower Body Dressing: with set-up;sit to/from stand Pt Will Transfer to Toilet: ambulating;with set-up;bedside commode Pt Will Perform Toileting - Clothing Manipulation and hygiene: sit to/from stand;with modified independence Pt Will Perform Tub/Shower Transfer: Tub transfer;ambulating;with set-up;with supervision (shower DME TBD) Additional ADL Goal #1: Pt will perform HEP for LUE independently, using handout as needed. Additional ADL Goal #2: Pt will don/doff sling with setup assist. Additional ADL Goal #3: Pt will be Mod I for bed mobility without use of rails and with HOB flat.  Plan Discharge plan remains appropriate    Co-evaluation                 End of Session Equipment Utilized During Treatment: Other (comment) (sling)   Activity Tolerance Patient tolerated treatment well   Patient Left in chair;with call bell/phone within reach   Nurse Communication Other (comment) (notified tech to get chair alarm for pt)        Time: [id number] OT Time Calculation (min): 22 min  Charges: OT General Charges $OT Visit: 1 Procedure OT Treatments $Self  Care/Home Management : 8-22 mins  Benito Mccreedy OTR/L [id number] 10/23/2015, 12:55 PM

## 2015-10-23 NOTE — Progress Notes (Signed)
Subjective: 2 Days Post-Op Procedure(s) (LRB): TOTAL SHOULDER ARTHROPLASTY (Left) Patient reports pain as 2 on 0-10 scale. Doing well today. Dressing changed and wound looks fine. Afebrile. Will hold her BP Meds since her BP is 107/47  Objective: Vital signs in last 24 hours: Temp:  [97.9 F (36.6 C)-98.7 F (37.1 C)] 98.1 F (36.7 C) (01/22 0446) Pulse Rate:  [82-85] 84 (01/22 0446) Resp:  [16] 16 (01/22 0446) BP: (107-122)/(47-58) 107/47 mmHg (01/22 0446) SpO2:  [91 %-95 %] 93 % (01/22 0446)  Intake/Output from previous day:   Intake/Output this shift:     Recent Labs  10/22/15 0334  HGB 11.2*    Recent Labs  10/22/15 0334  HCT 34.0*    Recent Labs  10/22/15 0334  NA 139  K 3.9  CL 105  CO2 25  BUN 10  CREATININE 0.78  GLUCOSE 131*  CALCIUM 8.1*   No results for input(s): LABPT, INR in the last 72 hours.  Will hold her BP Meds since her BP is 107/47  Assessment/Plan: 2 Days Post-Op Procedure(s) (LRB): TOTAL SHOULDER ARTHROPLASTY (Left) Up with therapy  Dawn Salinas A 10/23/2015, 9:50 AM

## 2015-10-24 ENCOUNTER — Encounter (HOSPITAL_COMMUNITY): Payer: Self-pay | Admitting: Orthopedic Surgery

## 2015-10-24 MED FILL — Thrombin For Soln 20000 Unit: CUTANEOUS | Qty: 1 | Status: AC

## 2015-10-24 NOTE — Clinical Social Work Note (Signed)
CSW spoke with patient who has [place] [employer] who requires an insurance authorization prior to discharge.  Unfortunately this process takes approximately two days.  There are facilities that would accept patient for PT without an insurance authorization, but would be [place] of county.  Patient does not wish to go out of county.  Patient wishes to be discharged home with [facility] PT/OT through [place].  RNCM notified and has already assisted patient with this request.  Disposition: Home with [facility] PT/OT/SW  Nonnie Done, LCSW [phone]  5N1-9; 2S 15-16 and [place] Licensed Clinical Social Worker

## 2015-10-24 NOTE — Progress Notes (Signed)
Orthopedics Progress Note  Subjective: Feeling better this morning  Objective:  Filed Vitals:   10/23/15 2046 10/24/15 0640  BP: 111/63 134/61  Pulse: 82 81  Temp: 100.2 F (37.9 C) 98.2 F (36.8 C)  Resp: 16 16    General: Awake and alert  Musculoskeletal: incision clean and dry and intact,  Neurovascularly intact  Lab Results  Component Value Date   WBC 11.0* 10/12/2015   HGB 11.2* 10/22/2015   HCT 34.0* 10/22/2015   MCV 86.3 10/12/2015   PLT 310 10/12/2015       Component Value Date/Time   NA 139 10/22/2015 0334   K 3.9 10/22/2015 0334   CL 105 10/22/2015 0334   CO2 25 10/22/2015 0334   GLUCOSE 131* 10/22/2015 0334   BUN 10 10/22/2015 0334   CREATININE 0.78 10/22/2015 0334   CALCIUM 8.1* 10/22/2015 0334   GFRNONAA >60 10/22/2015 0334   GFRAA >60 10/22/2015 0334    No results found for: INR, PROTIME  Assessment/Plan: POD #3 s/p Procedure(s): TOTAL SHOULDER ARTHROPLASTY Patient stable for discharge to SNF for short term  Stay as she lives alone Will need FL2  Remo Lipps R. Veverly Fells, MD 10/24/2015 7:53 AM

## 2015-10-24 NOTE — Progress Notes (Signed)
   Subjective: 3 Days Post-Op Procedure(s) (LRB): TOTAL SHOULDER ARTHROPLASTY (Left)  C/o mild pain to left shoulder Plan for rehab placement today Denies any new symptoms or issues Patient reports pain as mild.  Objective:   VITALS:   Filed Vitals:   10/23/15 2046 10/24/15 0640  BP: 111/63 134/61  Pulse: 82 81  Temp: 100.2 F (37.9 C) 98.2 F (36.8 C)  Resp: 16 16    Left shoulder incision healing well nv intact distally Mild ecchymosis Good early rom  LABS  Recent Labs  10/22/15 0334  HGB 11.2*  HCT 34.0*     Recent Labs  10/22/15 0334  NA 139  K 3.9  BUN 10  CREATININE 0.78  GLUCOSE 131*     Assessment/Plan: 3 Days Post-Op Procedure(s) (LRB): TOTAL SHOULDER ARTHROPLASTY (Left) Plan to d/c to rehab facility today  F/u in  2 weeks Continue with therapy exercises    Merla Riches, MPAS, PA-C  10/24/2015, 9:46 AM

## 2015-10-24 NOTE — Progress Notes (Signed)
Dawn Salinas discharged home per MD order. Discharge instructions reviewed and discussed with patient. All questions and concerns answered. Copy of instructions and scripts given to patient. IV removed.  Patient escorted to car by staff in a wheelchair. No distress noted upon discharge.   Dawn Salinas [place] 10/24/2015 5:02 PM

## 2015-10-24 NOTE — Care Management Note (Signed)
Case Management Note  Patient Details  Name: Dawn Salinas MRN: [id number] Date of Birth: 1945/10/28  Subjective/Objective:        S/p left total shoulder            Action/Plan: Spoke with patient about discharge plan which has switched to home with home therapy due to patient's doing well with therapy and patient not wanting facility options offered. Patient agreeable with home health and gave her a list of private duty agencies in case she wishes to hire additional assistance. Notified Dr. Veverly Fells of change in discharge plan from SNF to home. Received verbal order for home health and DME. Patient chose [facility]. Contacted Tiffany and set up HHPT, [place] and Education officer, museum. Patient stated that she has friend who can assist her prn and she will hire assistance if needed.Contacted James with [facility], cane and 3N1 delivered to patient's room.           Expected Discharge Date:                  Expected Discharge Plan:  [place]  In-House Referral:  Clinical Social Work  Discharge planning Services  CM Consult  Post Acute Care Choice:  Durable Medical Equipment, Home Health Choice offered to:  Patient  DME Arranged:  3-N-1, Kasandra Knudsen DME Agency:  [place]:  PT, OT, Social Work [employer] Agency:  [facility]  Status of Service:  Completed, signed off  Medicare Important Message Given:    Date Medicare IM Given:    Medicare IM give by:    Date Additional Medicare IM Given:    Additional Medicare Important Message give by:     If discussed at [place] of Stay Meetings, dates discussed:    Additional Comments:  Nila Nephew, RN 10/24/2015, 3:57 PM

## 2015-10-24 NOTE — Discharge Summary (Signed)
Physician Discharge Summary   Patient ID: Dawn Salinas MRN: [id number] DOB/AGE: [birth year] 69 y.o.  Admit date: 10/21/2015 Discharge date: 10/24/2015  Admission Diagnoses:  Active Problems:   S/P shoulder replacement   Discharge Diagnoses:  Same   Surgeries: Procedure(s): TOTAL SHOULDER ARTHROPLASTY on 10/21/2015   Consultants: PT/OT  Discharged Condition: Stable  Hospital Course: Dawn Salinas is an 70 y.o. female who was admitted 10/21/2015 with a chief complaint of No chief complaint on file. , and found to have a diagnosis of <principal problem not specified>.  They were brought to the operating room on 10/21/2015 and underwent the above named procedures.    The patient had an uncomplicated hospital course and was stable for discharge.  Recent vital signs:  Filed Vitals:   10/23/15 2046 10/24/15 0640  BP: 111/63 134/61  Pulse: 82 81  Temp: 100.2 F (37.9 C) 98.2 F (36.8 C)  Resp: 16 16    Recent laboratory studies:  Results for orders placed or performed during the hospital encounter of 10/21/15  Hemoglobin and hematocrit, blood  Result Value Ref Range   Hemoglobin 11.2 (L) 12.0 - 15.0 g/dL   HCT 34.0 (L) 36.0 - [id number] %  Basic metabolic panel  Result Value Ref Range   Sodium 139 135 - 145 mmol/L   Potassium 3.9 3.5 - 5.1 mmol/L   Chloride 105 101 - 111 mmol/L   CO2 25 22 - 32 mmol/L   Glucose, Bld 131 (H) 65 - 99 mg/dL   BUN 10 6 - 20 mg/dL   Creatinine, Ser 0.78 0.44 - 1.00 mg/dL   Calcium 8.1 (L) 8.9 - 10.3 mg/dL   GFR calc non Af Amer >60 >60 mL/min   GFR calc Af Amer >60 >60 mL/min   Anion gap 9 5 - 15    Discharge Medications:     Medication List    STOP taking these medications        celecoxib 100 MG capsule  Commonly known as:  CELEBREX      TAKE these medications        acetaminophen 500 MG tablet  Commonly known as:  TYLENOL  Take 1,000 mg by mouth every 8 (eight) hours as needed for mild pain or moderate pain.     amLODipine-benazepril 10-20 MG capsule  Commonly known as:  LOTREL  Take 1 capsule by mouth daily.     Biotin 5000 MCG Tabs  Take 1 tablet by mouth daily.     ICY HOT ADVANCED RELIEF EX  Apply 1 application topically daily as needed.     methocarbamol 500 MG tablet  Commonly known as:  ROBAXIN  Take 1 tablet (500 mg total) by mouth 3 (three) times daily as needed.     naproxen sodium 220 MG tablet  Commonly known as:  ANAPROX  Take 220 mg by mouth 2 (two) times daily with a meal.     omeprazole 20 MG capsule  Commonly known as:  PRILOSEC  TAKE 1 CAPSULE BY MOUTH EVERY DAY     oxybutynin 15 MG 24 hr tablet  Commonly known as:  DITROPAN XL  TAKE 1 TABLET BY MOUTH AT BEDTIME     oxyCODONE-acetaminophen 5-325 MG tablet  Commonly known as:  ROXICET  Take 1-2 tablets by mouth every 4 (four) hours as needed for severe pain.     sertraline 100 MG tablet  Commonly known as:  ZOLOFT  Take 200 mg by mouth Daily.  traMADol 50 MG tablet  Commonly known as:  ULTRAM  Take 50 mg by mouth every 6 (six) hours as needed for pain.        Diagnostic Studies: Dg Shoulder Left Port  10/21/2015  CLINICAL DATA:  Status post left shoulder replacement. EXAM: LEFT SHOULDER - 1 VIEW COMPARISON:  None. FINDINGS: There has been a left shoulder arthroplasty, with long stem humeral prosthesis in good alignment. There is no evidence of fracture. 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 IMPRESSION: Status post left shoulder arthroplasty without evidence of immediate complications. Electronically Signed   By: Fidela Salisbury M.D.   On: 10/21/2015 19:48    Disposition: 01-Home or Self Care        Follow-up Information    Follow up with NORRIS,STEVEN R, MD. Call in 2 weeks.   Specialty:  Orthopedic Surgery   Why:  [phone]   Contact information:   [address] [place] [address] [id number]        Signed: Ventura Bruns 10/24/2015, 9:48  AM

## 2015-10-24 NOTE — Progress Notes (Signed)
Occupational Therapy Treatment Patient Details Name: Dawn Salinas MRN: [id number] DOB: 02/04/1946 Today's Date: 10/24/2015    History of present illness 69 y.o. s/p left TSA. PMH includes hypertension, hyperlipidemia, GERD, hip pain, TKA, and left shoulder pain, and neck surgery.   OT comments  Pt. Making gains with acute OT but still requiring intermittent assistance for balance and safety during completion of functional mobility and ADLS.  Reports she does not have a lot of assistance at home and would feel better with short term SNF placement for continued therapies prior to d/c home.  Alerted r.n. For d/c plan request from pt.   Follow Up Recommendations  SNF    Equipment Recommendations  3 in 1 bedside comode    Recommendations for Other Services      Precautions / Restrictions Precautions Precautions: Shoulder;Fall Type of Shoulder Precautions: active protocol-AROM/PROM forward flexion-90 degrees, AROM/PROM abduction-60 degrees, AROM/PROM external rotation-30 degrees; no pushing, pulling, lifting with LUE (can use to hold light items) Shoulder Interventions: Shoulder sling/immobilizer;For comfort Precaution Comments: reviewed shoulder precautions Required Braces or Orthoses: Sling Restrictions Weight Bearing Restrictions: Yes LUE Weight Bearing: Non weight bearing       Mobility Bed Mobility Overal bed mobility: Needs Assistance Bed Mobility: Supine to Sit     Supine to sit: Supervision     General bed mobility comments: in recliner upon arrival  Transfers Overall transfer level: Needs assistance   Transfers: Sit to/from Stand Sit to Stand: Supervision         General transfer comment: supervision for safety    Balance Overall balance assessment: Needs assistance;History of Falls   Sitting balance-Leahy Scale: Good       Standing balance-Leahy Scale: Good                     ADL Overall ADL's : Needs assistance/impaired     Grooming:  Standing;Min guard Grooming Details (indicate cue type and reason): cues for one handed tech. while in standing                 Toilet Transfer: Supervision/safety;Ambulation Toilet Transfer Details (indicate cue type and reason): cues for only using r ue to reach for arm rest of 3n1 despite having l ue in sling pt. still attempts to use it.     Toileting - Clothing Manipulation Details (indicate cue type and reason): attempted to have pt. practice don/doff undergarments using one handed tech. she declined and said "oh i will not be doing that right now, i dont have to actually use the b.room and i dont want to practice that right now"   Tub/Shower Transfer Details (indicate cue type and reason): pt. wants to defer this goal to SNF rehab Functional mobility during ADLs: Supervision/safety General ADL Comments: upon entering the room pt. was seated in recliner and was reaching with L UE for something on side table, informed her she is not supposed to be using LUE.  she then leaned on left side and attempted to reach with r ue.  i walked in and stopped her and attempted to hand her what she needed.  reviewed all protocalls for no push, pull, no weight bearing.  she continued to state that she did not realize what she was doing fell into those categories and then kept saying how "stupid" she was.  provided support and explained she was not stupid she just made a mistake and now that she knows not to break those precautions.  pt. states  she has had many falls and really wants a short term SNF placement prior to d/c home      Vision                     Perception     Praxis      Cognition   Behavior During Therapy: WFL for tasks assessed/performed Overall Cognitive Status: Within Functional Limits for tasks assessed                       Extremity/Trunk Assessment  Upper Extremity Assessment Upper Extremity Assessment: Defer to OT evaluation   Lower Extremity  Assessment Lower Extremity Assessment: Overall WFL for tasks assessed   Cervical / Trunk Assessment Cervical / Trunk Assessment: Normal    Exercises     Shoulder Instructions       General Comments      Pertinent Vitals/ Pain       Pain Assessment:  (did not rate but states it hurt when she was reaching upon arrival. ) Pain Score: 2  Pain Location: left shoulder Pain Descriptors / Indicators: Aching Pain Intervention(s): Ice applied  Home Living Family/patient expects to be discharged to:: Private residence Living Arrangements: Alone Available Help at Discharge: Available PRN/intermittently   Home Access: Level entry     Home Layout: One level     Bathroom Shower/Tub: Teacher, early years/pre: Standard     Home Equipment: None   Additional Comments: pt reports at least 2 falls in the last year, none in the last 6 months      Prior Functioning/Environment Level of Independence: Independent            Frequency Min 2X/week     Progress Toward Goals  OT Goals(current goals can now be found in the care plan section)     Acute Rehab OT Goals Patient Stated Goal: be able to stop falling  Plan Discharge plan needs to be updated    Co-evaluation                 End of Session Equipment Utilized During Treatment: Gait belt;Other (comment) (sling)   Activity Tolerance Patient tolerated treatment well   Patient Left in chair;with call bell/phone within reach   Nurse Communication Other (comment) (informed rn of pt. c/o L shoulder pain after reaching)        Time:  -     Charges:    Dawn Salinas, COTA/L 10/24/2015, 10:31 AM

## 2015-10-24 NOTE — Evaluation (Signed)
Physical Therapy Evaluation Patient Details Name: Dawn Salinas MRN: [id number] DOB: August 29, 1946 Today's Date: 10/24/2015   History of Present Illness  70 y.o. s/p left TSA. PMH includes hypertension, hyperlipidemia, GERD, hip pain, TKA, and left shoulder pain, and neck surgery.  Clinical Impression  Pt pleasant and moving well with difficulty with bed transfers and impaired balance with gait. Pt states she has frequent falls and poor balance without assist of family at home. Pt educated for use of cane, gait deficits and need for additional balance/gait training. Pt will benefit from acute therapy to maximize mobility and gait to decrease fall risk. Initial supervision for mobility recommended with pt requesting SNF for D/C.     Follow Up Recommendations SNF (per pt request secondary to lack of caregiver and history of falls)    Equipment Recommendations  Cane    Recommendations for Other Services       Precautions / Restrictions Precautions Precautions: Shoulder;Fall Shoulder Interventions: Shoulder sling/immobilizer;For comfort Required Braces or Orthoses: Sling Restrictions LUE Weight Bearing: Non weight bearing      Mobility  Bed Mobility Overal bed mobility: Needs Assistance Bed Mobility: Supine to Sit     Supine to sit: Supervision     General bed mobility comments: pt with reliance on rail to pull herself to sitting despite cues for ease of transition with support of mattress  Transfers Overall transfer level: Needs assistance     Sit to Stand: Supervision         General transfer comment: supervision for safety  Ambulation/Gait Ambulation/Gait assistance: Min guard Ambulation Distance (Feet): 200 Feet Assistive device: Straight cane Gait Pattern/deviations: Step-through pattern;Decreased stride length   Gait velocity interpretation: Below normal speed for age/gender General Gait Details: cues for use of cane, minguard for balance as pt with veering  right x 3, increased stability with use of cane vs no DME  Stairs            Wheelchair Mobility    Modified Rankin (Stroke Patients Only)       Balance Overall balance assessment: Needs assistance;History of Falls   Sitting balance-Leahy Scale: Good       Standing balance-Leahy Scale: Good                               Pertinent Vitals/Pain Pain Score: 2  Pain Location: left shoulder Pain Descriptors / Indicators: Aching Pain Intervention(s): Limited activity within patient's tolerance;Repositioned    Home Living Family/patient expects to be discharged to:: Private residence Living Arrangements: Alone Available Help at Discharge: Available PRN/intermittently   Home Access: Level entry     Home Layout: One level Home Equipment: None Additional Comments: pt reports at least 2 falls in the last year, none in the last 6 months    Prior Function Level of Independence: Independent               Hand Dominance        Extremity/Trunk Assessment   Upper Extremity Assessment: Defer to OT evaluation           Lower Extremity Assessment: Overall WFL for tasks assessed      Cervical / Trunk Assessment: Normal  Communication   Communication: No difficulties  Cognition Arousal/Alertness: Awake/alert Behavior During Therapy: WFL for tasks assessed/performed Overall Cognitive Status: Within Functional Limits for tasks assessed  General Comments      Exercises        Assessment/Plan    PT Assessment Patient needs continued PT services  PT Diagnosis Abnormality of gait;Acute pain   PT Problem List Decreased strength;Decreased balance;Decreased activity tolerance;Pain;Decreased knowledge of use of DME  PT Treatment Interventions Gait training;DME instruction;Functional mobility training;Patient/family education   PT Goals (Current goals can be found in the Care Plan section) Acute Rehab PT  Goals Patient Stated Goal: be able to stop falling PT Goal Formulation: With patient Time For Goal Achievement: 10/31/15 Potential to Achieve Goals: Fair    Frequency Min 3X/week   Barriers to discharge Decreased caregiver support      Co-evaluation               End of Session Equipment Utilized During Treatment: Other (comment) (sling) Activity Tolerance: Patient tolerated treatment well Patient left: in chair;with call bell/phone within reach Nurse Communication: Mobility status         Time: [id number] PT Time Calculation (min) (ACUTE ONLY): 16 min   Charges:   PT Evaluation $PT Eval Low Complexity: 1 Procedure     PT G CodesMelford Aase 10/24/2015, 8:47 AM Elwyn Reach, [place]

## 2016-09-26 IMAGING — CR DG SHOULDER 1V*L*
1 series · 1 of 1 positions shown · non-contrast
Comparison: None.

CLINICAL DATA: Status post left shoulder replacement.

EXAM:
LEFT SHOULDER - 1 VIEW

[AP]
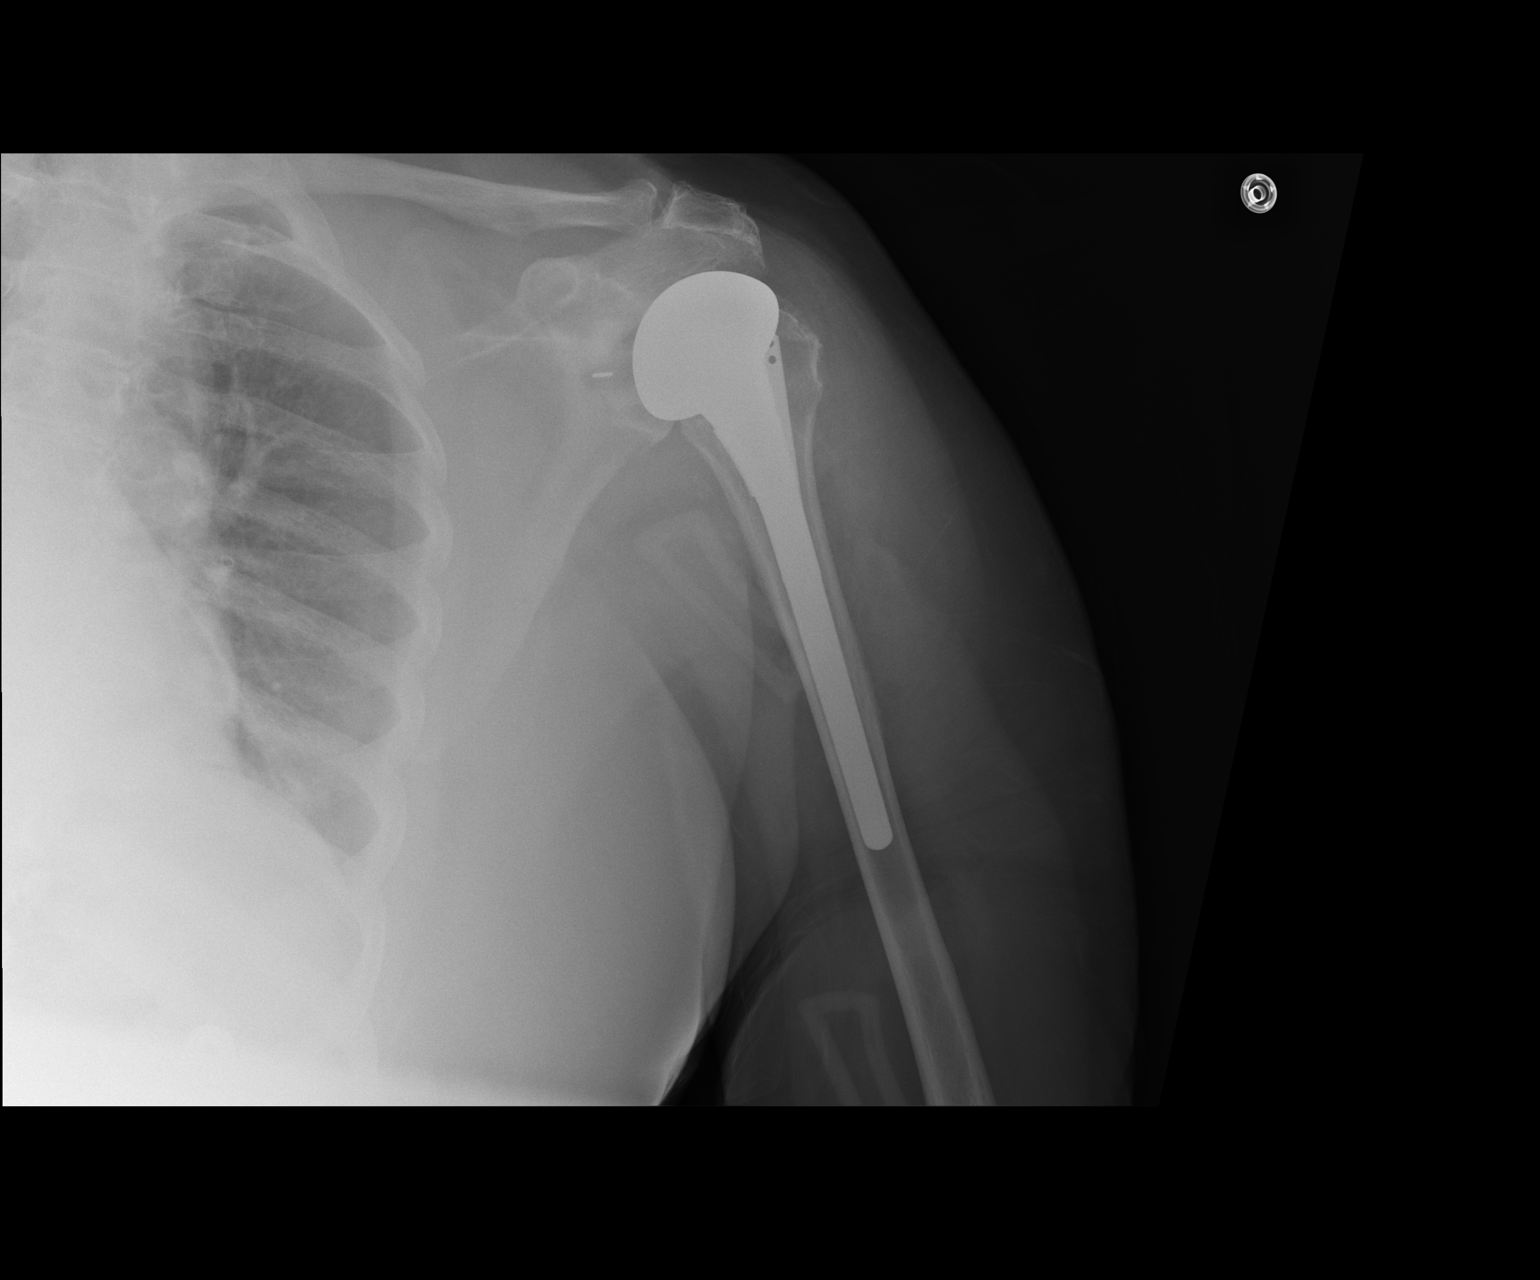

[1 of 1 positions shown; findings below may reference images not displayed]

FINDINGS: There has been a left shoulder arthroplasty, with long stem humeral
prosthesis in good alignment. There is no evidence of fracture.
Osteoarthritic changes of the acromioclavicular joint are seen.
IMPRESSION: Status post left shoulder arthroplasty without evidence of immediate
complications.

## 2017-04-17 NOTE — Progress Notes (Signed)
71 y.o. G2P2000 Legally Separated Caucasian female here for annual exam.    Multiple questions and concerns about the following:  1. One week ago developed vaginal burning and frequent urination.  Seen by Lenoard Aden on 04/10/17. Had urine culture, NuSwab, and HSV testing.  Treated with Ciprofloxacin and Nystatin.  She did not take the Ciprofloxacin. Told that her testing was negative for yeast infection and negative for urinary tract infection.  She is asking about the appropriateness of these Rxs.  2.  Biopsy of the vulva in 2015.  Results showed scar of the vagina and benign ulcer of the perineum.  No lichen sclerosus seen.   3.  Not sexually active for 2 years and may become active in the future.  Very excited to have a new friend and potential partner.  She states her ex-husband was unfaithful. Wants full STD screening.   4.  Wants to see urology again.  Dr. Isabella Bowens in [facility] is her urologist.  Just started on Vesicare by her PCP, Dr. Melford Aase.  Wears pads for urinary incontinence.  States she has a history of urinary retention.  Myrbetriq did not work well for her.  Does Kegel's.  Has had falls and did a brain scan done for evaluation.  Report is pending.   Loosing weight and eating healthier.   PCP:   Anastasia Pall, MD  Patient's last menstrual period was 10/01/1994.           Sexually active: No.  The current method of family planning is status post hysterectomy.    Exercising: Yes.    walking Smoker:  no  Health Maintenance: Pap:  Years ago  History of abnormal Pap:  no MMG:  04/05/14 breast density cat B, BIRADS 1 negative- has an appointment this month at [facility] Colonoscopy:  2009 negative  BMD:   04/05/14  Result  Osteopenia  TDaP:  10/02/11  Gardasil:   n/a Hep C: never Screening Labs: discuss with provider Hb today: same, Urine today: has sample if needed   reports that she has never smoked. She has never used smokeless tobacco. She reports that she  does not drink alcohol or use drugs.  Past Medical History:  Diagnosis Date  . Allergic rhinitis   . Depression   . Diverticulosis of colon (without mention of hemorrhage) 03/2008   Colonoscopy   . GERD (gastroesophageal reflux disease) 10/31/2012   Earlier Dx. with Asthma, but it was due to GERD by Dr. Asencion Noble  . Hip pain, acute 11/2012   Bursitis, got cortisone injection at Dr. Gladstone Lighter  . Hyperlipidemia   . Hypertension     Past Surgical History:  Procedure Laterality Date  . APPENDECTOMY    . HYSTEROSCOPY  1996   TAH secondary to Endometriosisand ovarian cyctectomy  . NECK SURGERY  2005   fusion C 4-5  . TOTAL KNEE ARTHROPLASTY Right 1996  . TOTAL SHOULDER ARTHROPLASTY Left 10/21/2015   Procedure: TOTAL SHOULDER ARTHROPLASTY;  Surgeon: Netta Cedars, MD;  Location: [place];  Service: Orthopedics;  Laterality: Left;    Current Outpatient Prescriptions  Medication Sig Dispense Refill  . acetaminophen (TYLENOL) 500 MG tablet Take 1,000 mg by mouth every 8 (eight) hours as needed for mild pain or moderate pain.    Marland Kitchen albuterol (PROVENTIL HFA;VENTOLIN HFA) 108 (90 Base) MCG/ACT inhaler INHALE 2 PUFFS INTO THE LUNGS EVERY 4 HOURS AS NEEDED FOR WHEEZING    . ALPRAZolam (XANAX) 0.5 MG tablet TAKE 1/2 TO 1 TABLET BY  MOUTH TWICE DAILY AS NEEDED FOR ANXIETY OR SLEEP    . ARIPiprazole (ABILIFY) 2 MG tablet     . Biotin 5000 MCG TABS Take 1 tablet by mouth daily.    Marland Kitchen conjugated estrogens (PREMARIN) vaginal cream Apply pea shaped amount as directed three times week.    . losartan (COZAAR) 100 MG tablet Take by mouth.    . nystatin cream (MYCOSTATIN) Apply topically.    Marland Kitchen omeprazole (PRILOSEC) 20 MG capsule TAKE 1 CAPSULE BY MOUTH EVERY DAY 30 capsule 0  . PRISTIQ 100 MG 24 hr tablet     . solifenacin (VESICARE) 5 MG tablet Take by mouth.     No current facility-administered medications for this visit.     Family History  Problem Relation Age of Onset  . Heart disease Mother   .  Hypertension Mother   . Heart disease Father   . Asthma Sister   . Emphysema Sister        COPD  . Hypertension Sister   . Heart disease Maternal Grandmother     ROS:  Pertinent items are noted in HPI.  Otherwise, a comprehensive ROS was negative.  Exam:   BP 128/68 (BP Location: Right Arm, Patient Position: Sitting, Cuff Size: Normal)   Pulse 88   Resp 16   Ht 5\' 2"  (1.575 m)   Wt 180 lb 4 oz (81.8 kg)   LMP 10/01/1994   BMI 32.97 kg/m     General appearance: alert, cooperative and appears stated age Head: Normocephalic, without obvious abnormality, atraumatic Neck: no adenopathy, supple, symmetrical, trachea midline and thyroid normal to inspection and palpation Lungs: clear to auscultation bilaterally Breasts: normal appearance, no masses or tenderness, No nipple retraction or dimpling, No nipple discharge or bleeding, No axillary or supraclavicular adenopathy Heart: regular rate and rhythm Abdomen: soft, non-tender; no masses, no organomegaly Extremities: extremities normal, atraumatic, no cyanosis or edema Skin: Skin color, texture, turgor normal. No rashes or lesions Lymph nodes: Cervical, supraclavicular, and axillary nodes normal. No abnormal inguinal nodes palpated Neurologic: Grossly normal  Pelvic: External genitalia:  no lesions              Urethra:  normal appearing urethra with no masses, tenderness or lesions              Bartholins and Skenes: normal                 Vagina: normal appearing vagina with normal color and discharge, no lesions              Cervix:  absent              Pap taken: No. Bimanual Exam:  Uterus:   Absent.              Adnexa: no mass, fullness, tenderness              Rectal exam: Yes.  .  Confirms.              Anus:  normal sphincter tone, no lesions  Chaperone was present for exam.  Assessment:   Well woman visit with normal exam. Status post hysterectomy.  Osteopenia.  Having falls. Desire for STD testing.    Plan: Mammogram screening discussed.  Has appt.  Recommended self breast awareness. Pap and HR HPV as above. Guidelines for Calcium, Vitamin D, regular exercise program including cardiovascular and weight bearing exercise. STD screening.  Affirm.  Discussed condom use. I recommend she  follow up with urology. She will contact their office as she is already established. No refills of estrogen cream until mammogram up to date. Will need to uptdate BMD. Follow up annually and prn.    After visit summary provided.   Additional consultation performed regarding vaginitis, UTIs, urinary retention, overactive bladder.

## 2017-04-18 ENCOUNTER — Encounter: Payer: Self-pay | Admitting: Obstetrics and Gynecology

## 2017-04-18 ENCOUNTER — Ambulatory Visit (INDEPENDENT_AMBULATORY_CARE_PROVIDER_SITE_OTHER): Payer: Medicare HMO | Admitting: Obstetrics and Gynecology

## 2017-04-18 VITALS — BP 128/68 | HR 88 | Resp 16 | Ht 62.0 in | Wt 180.2 lb

## 2017-04-18 DIAGNOSIS — M8580 Other specified disorders of bone density and structure, unspecified site: Secondary | ICD-10-CM | POA: Diagnosis not present

## 2017-04-18 DIAGNOSIS — N760 Acute vaginitis: Secondary | ICD-10-CM

## 2017-04-18 DIAGNOSIS — Z113 Encounter for screening for infections with a predominantly sexual mode of transmission: Secondary | ICD-10-CM

## 2017-04-18 DIAGNOSIS — Z01419 Encounter for gynecological examination (general) (routine) without abnormal findings: Secondary | ICD-10-CM | POA: Diagnosis not present

## 2017-04-18 NOTE — Patient Instructions (Signed)

## 2017-04-19 LAB — HEPATITIS C ANTIBODY

## 2017-04-19 LAB — HEP, RPR, HIV PANEL
HEP B S AG: NEGATIVE
HIV Screen 4th Generation wRfx: NONREACTIVE
RPR Ser Ql: NONREACTIVE

## 2017-04-19 LAB — VAGINITIS/VAGINOSIS, DNA PROBE
Candida Species: NEGATIVE
Gardnerella vaginalis: NEGATIVE
Trichomonas vaginosis: NEGATIVE

## 2017-04-19 LAB — HSV(HERPES SIMPLEX VRS) I + II AB-IGG
HSV 1 GLYCOPROTEIN G AB, IGG: 41.5 {index} — AB (ref 0.00–0.90)
HSV 2 Glycoprotein G Ab, IgG: <0.91 index (ref 0.00–0.90)

## 2017-04-20 ENCOUNTER — Telehealth: Payer: Self-pay | Admitting: Obstetrics and Gynecology

## 2017-04-20 LAB — GC/CHLAMYDIA PROBE AMP
Chlamydia trachomatis, NAA: NEGATIVE
Neisseria gonorrhoeae by PCR: NEGATIVE

## 2017-04-20 NOTE — Telephone Encounter (Signed)
Please contact patient to [facility].  She has osteopenia and is having falls.  I want to assess her bone status.  I placed an order, so she can call to schedule.

## 2017-04-22 NOTE — Telephone Encounter (Signed)
Nunzio Cobbs, MD  P [facility] Triage Pool        Please contact patient with results so far.  Her testing is negative for HIV, syphilis, hepatitis B and C, trichomonas, yeast, and bacterial vaginosis.  She tested negative for HSV II (genital herpes).  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 The HSV I can be transmitted through oral/oral contact, oral/genital contact, or genital/genital contact.   If the HSV positive status is something new for her, she may want to make an appointment to come in and talk about this.  There are antiviral medications to treat and prevent outbreaks.   Her gonorrhea and chlamydia testing are pending.

## 2017-04-22 NOTE — Telephone Encounter (Signed)
Left message to call Kerra Guilfoil at [phone]. 

## 2017-04-24 NOTE — Telephone Encounter (Signed)
Left message to call Kaitlyn at [phone]. 

## 2017-04-25 NOTE — Telephone Encounter (Signed)
Spoke with patient. Advised of message and results as seen below from [facility] as well as negative Gc/Chl testing. Patient verbalizes understanding.

## 2017-05-09 ENCOUNTER — Encounter: Payer: Self-pay | Admitting: Obstetrics and Gynecology

## 2017-06-25 ENCOUNTER — Encounter: Payer: Self-pay | Admitting: Obstetrics and Gynecology

## 2018-01-24 ENCOUNTER — Telehealth: Payer: Self-pay | Admitting: Internal Medicine

## 2018-01-24 NOTE — Telephone Encounter (Signed)
Pt is equesting rf for lomotil, Her pharmacy told her that it was denied. She has an appt scheduled on 02/10/18.

## 2018-01-24 NOTE — Telephone Encounter (Signed)
Sent in error

## 2018-02-18 ENCOUNTER — Encounter: Payer: Self-pay | Admitting: Gastroenterology

## 2018-04-23 ENCOUNTER — Ambulatory Visit: Payer: Medicare HMO | Admitting: Obstetrics and Gynecology

## 2018-04-23 ENCOUNTER — Encounter: Payer: Self-pay | Admitting: Obstetrics and Gynecology

## 2018-04-23 NOTE — Progress Notes (Deleted)
72 y.o. G2P2000 Legally Separated Caucasian female here for annual exam.    PCP:     Patient's last menstrual period was 10/01/1994.           Sexually active: {yes no:314532}  The current method of family planning is status post hysterectomy.    Exercising: {yes no:314532}  {types:19826} Smoker:  no  Health Maintenance: Pap:  Years ago History of abnormal Pap:  no MMG:  04/26/17 BIRADS 2 benign/density a Colonoscopy:  *** BMD:   ***  Result  *** TDaP:  *** Gardasil:   {YES NO:22349} HIV and Hep C: 04/18/17 Negative Screening Labs:  Hb today: ***, Urine today: ***   reports that she has never smoked. She has never used smokeless tobacco. She reports that she does not drink alcohol or use drugs.  Past Medical History:  Diagnosis Date  . Allergic rhinitis   . Depression   . Diverticulosis of colon (without mention of hemorrhage) 03/2008   Colonoscopy   . GERD (gastroesophageal reflux disease) 10/31/2012   Earlier Dx. with Asthma, but it was due to GERD by Dr. Asencion Noble  . Hip pain, acute 11/2012   Bursitis, got cortisone injection at Dr. Gladstone Lighter  . Hyperlipidemia   . Hypertension     Past Surgical History:  Procedure Laterality Date  . APPENDECTOMY    . HYSTEROSCOPY  1996   TAH secondary to Endometriosisand ovarian cyctectomy  . NECK SURGERY  2005   fusion C 4-5  . TOTAL KNEE ARTHROPLASTY Right 1996  . TOTAL SHOULDER ARTHROPLASTY Left 10/21/2015   Procedure: TOTAL SHOULDER ARTHROPLASTY;  Surgeon: Netta Cedars, MD;  Location: [place];  Service: Orthopedics;  Laterality: Left;    Current Outpatient Medications  Medication Sig Dispense Refill  . acetaminophen (TYLENOL) 500 MG tablet Take 1,000 mg by mouth every 8 (eight) hours as needed for mild pain or moderate pain.    Marland Kitchen albuterol (PROVENTIL HFA;VENTOLIN HFA) 108 (90 Base) MCG/ACT inhaler INHALE 2 PUFFS INTO THE LUNGS EVERY 4 HOURS AS NEEDED FOR WHEEZING    . ALPRAZolam (XANAX) 0.5 MG tablet TAKE 1/2 TO 1 TABLET BY  MOUTH TWICE DAILY AS NEEDED FOR ANXIETY OR SLEEP    . ARIPiprazole (ABILIFY) 2 MG tablet     . Biotin 5000 MCG TABS Take 1 tablet by mouth daily.    Marland Kitchen conjugated estrogens (PREMARIN) vaginal cream Apply pea shaped amount as directed three times week.    . losartan (COZAAR) 100 MG tablet Take by mouth.    Marland Kitchen omeprazole (PRILOSEC) 20 MG capsule TAKE 1 CAPSULE BY MOUTH EVERY DAY 30 capsule 0  . PRISTIQ 100 MG 24 hr tablet     . solifenacin (VESICARE) 5 MG tablet Take by mouth.     No current facility-administered medications for this visit.     Family History  Problem Relation Age of Onset  . Heart disease Mother   . Hypertension Mother   . Heart disease Father   . Asthma Sister   . Emphysema Sister        COPD  . Hypertension Sister   . Heart disease Maternal Grandmother     Review of Systems  Exam:   LMP 10/01/1994     General appearance: alert, cooperative and appears stated age Head: Normocephalic, without obvious abnormality, atraumatic Neck: no adenopathy, supple, symmetrical, trachea midline and thyroid normal to inspection and palpation Lungs: clear to auscultation bilaterally Breasts: normal appearance, no masses or tenderness, No nipple retraction or dimpling,  No nipple discharge or bleeding, No axillary or supraclavicular adenopathy Heart: regular rate and rhythm Abdomen: soft, non-tender; no masses, no organomegaly Extremities: extremities normal, atraumatic, no cyanosis or edema Skin: Skin color, texture, turgor normal. No rashes or lesions Lymph nodes: Cervical, supraclavicular, and axillary nodes normal. No abnormal inguinal nodes palpated Neurologic: Grossly normal  Pelvic: External genitalia:  no lesions              Urethra:  normal appearing urethra with no masses, tenderness or lesions              Bartholins and Skenes: normal                 Vagina: normal appearing vagina with normal color and discharge, no lesions              Cervix: no lesions               Pap taken: {yes no:314532} Bimanual Exam:  Uterus:  normal size, contour, position, consistency, mobility, non-tender              Adnexa: no mass, fullness, tenderness              Rectal exam: {yes no:314532}.  Confirms.              Anus:  normal sphincter tone, no lesions  Chaperone was present for exam.  Assessment:   Well woman visit with normal exam.   Plan: Mammogram screening. Recommended self breast awareness. Pap and HR HPV as above. Guidelines for Calcium, Vitamin D, regular exercise program including cardiovascular and weight bearing exercise.   Follow up annually and prn.   Additional counseling given.  {yes [id number]. _______ minutes face to face time of which over 50% was spent in counseling.    After visit summary provided.

## 2018-06-11 ENCOUNTER — Encounter: Payer: Self-pay | Admitting: Obstetrics and Gynecology

## 2018-06-11 ENCOUNTER — Ambulatory Visit (INDEPENDENT_AMBULATORY_CARE_PROVIDER_SITE_OTHER): Payer: Medicare HMO | Admitting: Obstetrics and Gynecology

## 2018-06-11 ENCOUNTER — Other Ambulatory Visit: Payer: Self-pay

## 2018-06-11 VITALS — BP 150/76 | HR 72 | Resp 16 | Ht 61.5 in | Wt 182.5 lb

## 2018-06-11 DIAGNOSIS — Z01419 Encounter for gynecological examination (general) (routine) without abnormal findings: Secondary | ICD-10-CM

## 2018-06-11 NOTE — Patient Instructions (Signed)

## 2018-06-11 NOTE — Progress Notes (Addendum)
72 y.o. G2P2000 Legally Separated Caucasian female here for annual exam.    Fecal urgency.   Urinary urgency.  Taking oxybutynin, but not daily.  Using vaginal cream somewhat.   Dealing with carpal tunnel. Wants testing for sleep apnea.  She states she has chronic anemia.   Ex - husband deceased from pancreatic cancer.  Now she is taking care of her.  She had a relationship after her divorce last year.  She had STD testing last year.   PCP:   Anastasia Pall, MD  Patient's last menstrual period was 10/01/1994.           Sexually active: No.  The current method of family planning is status post hysterectomy.    Exercising: Yes.    walking Smoker:  no  Health Maintenance: Pap:  Years ago  History of abnormal Pap:  no MMG:  04-26-17 BIRADS 2 benign  Colonoscopy:  2009 negative.  She declines.  BMD:   04-26-17   Result  Osteopenia  TDaP:  10-02-11  Gardasil:   n/a Hep C: never Screening Labs:  Hb today: PCP, Urine today: not collected    reports that she has never smoked. She has never used smokeless tobacco. She reports that she does not drink alcohol or use drugs.  Past Medical History:  Diagnosis Date  . Allergic rhinitis   . Depression   . Diverticulosis of colon (without mention of hemorrhage) 03/2008   Colonoscopy   . GERD (gastroesophageal reflux disease) 10/31/2012   Earlier Dx. with Asthma, but it was due to GERD by Dr. Asencion Noble  . Hip pain, acute 11/2012   Bursitis, got cortisone injection at Dr. Gladstone Lighter  . Hyperlipidemia   . Hypertension     Past Surgical History:  Procedure Laterality Date  . APPENDECTOMY    . HYSTEROSCOPY  1996   TAH secondary to Endometriosisand ovarian cyctectomy  . NECK SURGERY  2005   fusion C 4-5  . TOTAL KNEE ARTHROPLASTY Right 1996  . TOTAL SHOULDER ARTHROPLASTY Left 10/21/2015   Procedure: TOTAL SHOULDER ARTHROPLASTY;  Surgeon: Netta Cedars, MD;  Location: [place];  Service: Orthopedics;  Laterality: Left;    Current  Outpatient Medications  Medication Sig Dispense Refill  . acetaminophen (TYLENOL) 500 MG tablet Take 1,000 mg by mouth every 8 (eight) hours as needed for mild pain or moderate pain.    Marland Kitchen albuterol (PROVENTIL HFA;VENTOLIN HFA) 108 (90 Base) MCG/ACT inhaler INHALE 2 PUFFS INTO THE LUNGS EVERY 4 HOURS AS NEEDED FOR WHEEZING    . ALPRAZolam (XANAX) 0.5 MG tablet TAKE 1/2 TO 1 TABLET BY MOUTH TWICE DAILY AS NEEDED FOR ANXIETY OR SLEEP    . Biotin 5000 MCG TABS Take 1 tablet by mouth daily.    . candesartan (ATACAND) 32 MG tablet TAKE 1 TABLET EVERY DAY    . conjugated estrogens (PREMARIN) vaginal cream Apply pea shaped amount as directed three times week.    . escitalopram (LEXAPRO) 20 MG tablet Take by mouth.    Marland Kitchen omeprazole (PRILOSEC) 20 MG capsule TAKE 1 CAPSULE BY MOUTH EVERY DAY 30 capsule 0  . oxybutynin (DITROPAN-XL) 10 MG 24 hr tablet Take 10 mg by mouth daily.  0  . predniSONE (DELTASONE) 20 MG tablet TAKE 2 TABLETS FOR 3 DAYS, THEN 1 TABLET FOR 3 DAYS, THEN 1/2 TABLET FOR 4 DAYS.  0  . traMADol (ULTRAM) 50 MG tablet Take by mouth.     No current facility-administered medications for this visit.  Family History  Problem Relation Age of Onset  . Heart disease Mother   . Hypertension Mother   . Heart disease Father   . Asthma Sister   . Emphysema Sister        COPD  . Hypertension Sister   . Heart disease Maternal Grandmother     Review of Systems  Genitourinary: Positive for dysuria, frequency, hematuria and urgency.       Loss of urine spontaneously  Loss of urine with cough or sneeze   Musculoskeletal: Positive for arthralgias and joint swelling.       Muscle weakness   Skin: Negative.   Allergic/Immunologic: Negative.   Neurological: Negative.   Hematological: Negative.   Psychiatric/Behavioral: Negative.     Exam:   BP (!) 150/76 (BP Location: Left Arm, Patient Position: Sitting, Cuff Size: Normal)   Pulse 72   Resp 16   Ht 5' 1.5" (1.562 m)   Wt 182 lb 8  oz (82.8 kg)   LMP 10/01/1994   BMI 33.92 kg/m     General appearance: alert, cooperative and appears stated age Head: Normocephalic, without obvious abnormality, atraumatic Neck: no adenopathy, supple, symmetrical, trachea midline and thyroid normal to inspection and palpation Lungs: clear to auscultation bilaterally Breasts: normal appearance, no masses or tenderness, No nipple retraction or dimpling, No nipple discharge or bleeding, No axillary or supraclavicular adenopathy Heart: regular rate and rhythm Abdomen: soft, non-tender; no masses, no organomegaly Extremities: extremities normal, atraumatic, no cyanosis or edema Skin: Skin color, texture, turgor normal. No rashes or lesions Lymph nodes: Cervical, supraclavicular, and axillary nodes normal. No abnormal inguinal nodes palpated Neurologic: Grossly normal  Pelvic: External genitalia:  no lesions              Urethra:  normal appearing urethra with no masses, tenderness or lesions              Bartholins and Skenes: normal                 Vagina: normal appearing vagina with normal color and discharge, no lesions              Cervix:  absent              Pap taken: No. Bimanual Exam:  Uterus:   absent              Adnexa: no mass, fullness, tenderness              Rectal exam: Yes.  .  Confirms.              Anus:  normal sphincter tone, no lesions  Chaperone was present for exam.  Assessment:   Well woman visit with normal exam. Status post hysterectomy and ovarian cystectomy.  Osteopenia.  Fecal urgency.  Urinary urgency.  On Ditropan XL.  Plan: Mammogram screening recommended. Recommended self breast awareness. Pap and HR HPV as above. Guidelines for Calcium, Vitamin D, regular exercise program including cardiovascular and weight bearing exercise. BMD next year.  Metamucil recommended. BMD 2020. Bereavement support given.    Follow up annually and prn.   After visit summary provided.

## 2019-04-02 ENCOUNTER — Telehealth: Payer: Self-pay | Admitting: Cardiology

## 2019-04-02 NOTE — Progress Notes (Signed)
Dawn Sa, FNP Reason for referral-chest pain  HPI: 73 year old female for evaluation of chest pain at request of Vicenta Aly, FNP.  Patient seen previously but not since December 2011.  By report she has had 2 prior catheterizations in 1999 and 2000 which were normal but no records available.  Stress echocardiogram January 2012 normal.  Back in May patient had chest heaviness for 3 consecutive days.  She also notes dyspnea on exertion.  There is no orthopnea, PND or pedal edema.  She has not had syncope.  Her blood pressure has been elevated recently.  Cardiology now asked to evaluate.  Current Outpatient Medications  Medication Sig Dispense Refill  . acetaminophen (TYLENOL) 500 MG tablet Take 1,000 mg by mouth every 8 (eight) hours as needed for mild pain or moderate pain.    Marland Kitchen albuterol (PROVENTIL HFA;VENTOLIN HFA) 108 (90 Base) MCG/ACT inhaler INHALE 2 PUFFS INTO THE LUNGS EVERY 4 HOURS AS NEEDED FOR WHEEZING    . ALPRAZolam (XANAX) 0.5 MG tablet TAKE 1/2 TO 1 TABLET BY MOUTH TWICE DAILY AS NEEDED FOR ANXIETY OR SLEEP    . amLODipine (NORVASC) 2.5 MG tablet Take 2.5 mg by mouth daily.    . Biotin 5000 MCG TABS Take 1 tablet by mouth daily.    . candesartan (ATACAND) 32 MG tablet TAKE 1 TABLET EVERY DAY    . conjugated estrogens (PREMARIN) vaginal cream Apply pea shaped amount as directed three times week.    . diphenhydrAMINE HCl (BENADRYL ALLERGY PO) Take 1 tablet by mouth daily.    Marland Kitchen escitalopram (LEXAPRO) 20 MG tablet Take by mouth.    . oxybutynin (DITROPAN-XL) 10 MG 24 hr tablet Take 10 mg by mouth daily.  0   No current facility-administered medications for this visit.     Allergies  Allergen Reactions  . Mirabegron Other (See Comments)    Isolated hip joint/tendon pain, decreased strength.  . Cephalexin Itching and Rash  . Codeine Itching and Rash    REACTION: rash, itching, hives     Past Medical History:  Diagnosis Date  . Allergic rhinitis   .  Asthma   . Depression   . Diverticulosis of colon (without mention of hemorrhage) 03/2008   Colonoscopy   . GERD (gastroesophageal reflux disease) 10/31/2012   Earlier Dx. with Asthma, but it was due to GERD by Dr. Asencion Noble  . Hip pain, acute 11/2012   Bursitis, got cortisone injection at Dr. Gladstone Lighter  . Hyperlipidemia   . Hypertension     Past Surgical History:  Procedure Laterality Date  . APPENDECTOMY    . HYSTEROSCOPY  1996   TAH secondary to Endometriosisand ovarian cyctectomy  . NECK SURGERY  2005   fusion C 4-5  . TOTAL KNEE ARTHROPLASTY Right 1996  . TOTAL SHOULDER ARTHROPLASTY Left 10/21/2015   Procedure: TOTAL SHOULDER ARTHROPLASTY;  Surgeon: Netta Cedars, MD;  Location: [facility];  Service: Orthopedics;  Laterality: Left;    Social History   Socioeconomic History  . Marital status: Divorced    Spouse name: Tinika Bucknam  . Number of children: 2  . Years of education: 31  . Highest education level: Not on file  Occupational History  . Occupation: Retired Mining engineer: RETIRED  Social Needs  . Financial resource strain: Not on file  . Food insecurity    Worry: Not on file    Inability: Not on file  . Transportation needs    Medical: Not on file  Non-medical: Not on file  Tobacco Use  . Smoking status: Never Smoker  . Smokeless tobacco: Never Used  Substance and Sexual Activity  . Alcohol use: No    Alcohol/week: 0.0 standard drinks  . Drug use: No  . Sexual activity: Never    Partners: Male    Birth control/protection: Surgical    Comment: Hyst  Lifestyle  . Physical activity    Days per week: Not on file    Minutes per session: Not on file  . Stress: Not on file  Relationships  . Social Herbalist on phone: Not on file    Gets together: Not on file    Attends religious service: Not on file    Active member of club or organization: Not on file    Attends meetings of clubs or organizations: Not on file    Relationship status: Not  on file  . Intimate partner violence    Fear of current or ex partner: Not on file    Emotionally abused: Not on file    Physically abused: Not on file    Forced sexual activity: Not on file  Other Topics Concern  . Not on file  Social History Narrative  . Not on file    Family History  Problem Relation Age of Onset  . Heart disease Mother   . Hypertension Mother   . Heart disease Father   . Asthma Sister   . Emphysema Sister        COPD  . Hypertension Sister   . Heart disease Maternal Grandmother   . CAD Brother     ROS: no fevers or chills, productive cough, hemoptysis, dysphasia, odynophagia, melena, hematochezia, dysuria, hematuria, rash, seizure activity, orthopnea, PND, pedal edema, claudication. Remaining systems are negative.  Physical Exam:   Blood pressure (!) 150/64, pulse 87, temperature 98.4 F (36.9 C), height 5\' 4"  (1.626 m), weight 191 lb (86.6 kg), last menstrual period 10/01/1994.  General:  Well developed/well nourished in NAD Skin warm/dry Patient not depressed No peripheral clubbing Back-normal HEENT-normal/normal eyelids Neck supple/normal carotid upstroke bilaterally; no bruits; no JVD; no thyromegaly chest - CTA/ normal expansion CV - RRR/normal S1 and S2; no murmurs, rubs or gallops;  PMI nondisplaced Abdomen -NT/ND, no HSM, no mass, + bowel sounds, no bruit 2+ femoral pulses, no bruits Ext-no edema, chords, 2+ DP Neuro-grossly nonfocal  ECG -sinus rhythm at a rate of 87, nonspecific ST changes.  Personally reviewed  A/P  1 chest pain-symptoms are atypical but she is concerned about her family history.  We will arrange a Dora nuclear study to screen for ischemia.  2 Hypertension-blood pressure elevated.  Increase amlodipine from 2.5 mg daily to 5 mg daily.  Follow blood pressure and advance regimen as needed.  3 Dyspnea-as outlined above we will arrange a stress nuclear study for risk stratification.  4 Hyperlipidemia-she is  intolerant to statins.  We will obtain most recent lipids from her primary care.  Can consider Zetia or Repatha if needed.  Kirk Ruths, MD

## 2019-04-02 NOTE — Telephone Encounter (Signed)
LVM, reminding pt of her appt with Dr Stanford Breed on 04-05-19.

## 2019-04-06 ENCOUNTER — Encounter: Payer: Self-pay | Admitting: Cardiology

## 2019-04-06 ENCOUNTER — Ambulatory Visit: Payer: Medicare HMO | Admitting: Cardiology

## 2019-04-06 ENCOUNTER — Other Ambulatory Visit: Payer: Self-pay

## 2019-04-06 VITALS — BP 150/64 | HR 87 | Temp 98.4°F | Ht 64.0 in | Wt 191.0 lb

## 2019-04-06 DIAGNOSIS — E7800 Pure hypercholesterolemia, unspecified: Secondary | ICD-10-CM | POA: Diagnosis not present

## 2019-04-06 DIAGNOSIS — R0600 Dyspnea, unspecified: Secondary | ICD-10-CM

## 2019-04-06 DIAGNOSIS — R072 Precordial pain: Secondary | ICD-10-CM

## 2019-04-06 DIAGNOSIS — I10 Essential (primary) hypertension: Secondary | ICD-10-CM | POA: Diagnosis not present

## 2019-04-06 MED ORDER — AMLODIPINE BESYLATE 5 MG PO TABS: 5.0000 mg | ORAL_TABLET | Freq: Every day | ORAL | 3 refills | Status: DC

## 2019-04-06 NOTE — Patient Instructions (Signed)
Medication Instructions:  INCREASE AMLODIPINE TO 5 MG ONCE DAILY= 2 OF THE 2.5 MG TABLETS ONCE DAILY If you need a refill on your cardiac medications before your next appointment, please call your pharmacy.   Lab work: If you have labs (blood work) drawn today and your tests are completely normal, you will receive your results only by: Marland Kitchen MyChart Message (if you have MyChart) OR . A paper copy in the mail If you have any lab test that is abnormal or we need to change your treatment, we will call you to review the results.  Testing/Procedures: Your physician has requested that you have a lexiscan myoview. For further information please visit [email or web address]. Please follow instruction sheet, as given.[facility]    Follow-Up: At [facility], you and your health needs are our priority.  As part of our continuing mission to provide you with exceptional heart care, we have created designated Provider Care Teams.  These Care Teams include your primary Cardiologist (physician) and Advanced Practice Providers (APPs -  Physician Assistants and Nurse Practitioners) who all work together to provide you with the care you need, when you need it. You will need a follow up appointment in 6 months.  Please call our office 2 months in advance to schedule this appointment.  You may see Kirk Ruths MD or one of the following Advanced Practice Providers on your designated Care Team:   Kerin Ransom, PA-C Roby Lofts, [place] . Sande Rives, PA-C

## 2019-04-22 ENCOUNTER — Telehealth (HOSPITAL_COMMUNITY): Payer: Self-pay

## 2019-04-22 NOTE — Telephone Encounter (Signed)
Encounter complete. 

## 2019-04-23 ENCOUNTER — Other Ambulatory Visit: Payer: Self-pay

## 2019-04-23 ENCOUNTER — Ambulatory Visit (HOSPITAL_COMMUNITY)
Admission: RE | Admit: 2019-04-23 | Discharge: 2019-04-23 | Disposition: A | Discharge status: Home / Self Care | Payer: Medicare HMO | Source: Ambulatory Visit | Attending: Cardiology | Admitting: Cardiology

## 2019-04-23 DIAGNOSIS — R072 Precordial pain: Secondary | ICD-10-CM

## 2019-04-23 LAB — MYOCARDIAL PERFUSION IMAGING
LV dias vol: 64 mL (ref 46–106)
LV sys vol: 19 mL
Peak HR: 92 {beats}/min
Rest HR: 72 {beats}/min
SDS: 2
SRS: 1
SSS: 3
TID: 1.02

## 2019-04-23 MED ORDER — TECHNETIUM TC 99M TETROFOSMIN IV KIT
10.7000 | PACK | Freq: Once | INTRAVENOUS | Status: AC | PRN
  Administered 2019-04-23: 10.7 via INTRAVENOUS
  Filled 2019-04-23: qty 11

## 2019-04-23 MED ORDER — TECHNETIUM TC 99M TETROFOSMIN IV KIT
30.8000 | PACK | Freq: Once | INTRAVENOUS | Status: AC | PRN
  Administered 2019-04-23: 30.8 via INTRAVENOUS
  Filled 2019-04-23: qty 31

## 2019-04-23 MED ORDER — REGADENOSON 0.4 MG/5ML IV SOLN
0.4000 mg | Freq: Once | INTRAVENOUS | Status: AC
  Administered 2019-04-23: 0.4 mg via INTRAVENOUS

## 2019-06-22 ENCOUNTER — Other Ambulatory Visit: Payer: Self-pay

## 2019-06-22 ENCOUNTER — Ambulatory Visit (INDEPENDENT_AMBULATORY_CARE_PROVIDER_SITE_OTHER): Payer: Medicare HMO | Admitting: Obstetrics and Gynecology

## 2019-06-22 ENCOUNTER — Encounter: Payer: Self-pay | Admitting: Obstetrics and Gynecology

## 2019-06-22 VITALS — BP 142/76 | HR 80 | Temp 97.3°F | Resp 14 | Ht 61.75 in | Wt 189.4 lb

## 2019-06-22 DIAGNOSIS — Z01419 Encounter for gynecological examination (general) (routine) without abnormal findings: Secondary | ICD-10-CM

## 2019-06-22 MED ORDER — NYSTATIN 100000 UNIT/GM EX POWD: Freq: Three times a day (TID) | CUTANEOUS | 2 refills | Status: DC

## 2019-06-22 NOTE — Patient Instructions (Addendum)
Hello Ms. Dawn Salinas,   You are due for the following screening tests: Mammogram, bone density, blood work, a flu vaccine, and colonoscopy.  Good to see you!  Dawn Half, MD  EXERCISE AND DIET:  We recommended that you start or continue a regular exercise program for good health. Regular exercise means any activity that makes your heart beat faster and makes you sweat.  We recommend exercising at least 30 minutes per day at least 3 days a week, preferably 4 or 5.  We also recommend a diet low in fat and sugar.  Inactivity, poor dietary choices and obesity can cause diabetes, heart attack, stroke, and kidney damage, among others.    ALCOHOL AND SMOKING:  Women should limit their alcohol intake to no more than 7 drinks/beers/glasses of wine (combined, not each!) per week. Moderation of alcohol intake to this level decreases your risk of breast cancer and liver damage. And of course, no recreational drugs are part of a healthy lifestyle.  And absolutely no smoking or even second hand smoke. Most people know smoking can cause heart and lung diseases, but did you know it also contributes to weakening of your bones? Aging of your skin?  Yellowing of your teeth and nails?  CALCIUM AND VITAMIN D:  Adequate intake of calcium and Vitamin D are recommended.  The recommendations for exact amounts of these supplements seem to change often, but generally speaking 600 mg of calcium (either carbonate or citrate) and 800 units of Vitamin D per day seems prudent. Certain women may benefit from higher intake of Vitamin D.  If you are among these women, your doctor will have told you during your visit.    PAP SMEARS:  Pap smears, to check for cervical cancer or precancers,  have traditionally been done yearly, although recent scientific advances have shown that most women can have pap smears less often.  However, every woman still should have a physical exam from her gynecologist every year. It will include a breast check,  inspection of the vulva and vagina to check for abnormal growths or skin changes, a visual exam of the cervix, and then an exam to evaluate the size and shape of the uterus and ovaries.  And after 72 years of age, a rectal exam is indicated to check for rectal cancers. We will also provide age appropriate advice regarding health maintenance, like when you should have certain vaccines, screening for sexually transmitted diseases, bone density testing, colonoscopy, mammograms, etc.   MAMMOGRAMS:  All women over 73 years old should have a yearly mammogram. Many facilities now offer a "3D" mammogram, which may cost around $50 extra out of pocket. If possible,  we recommend you accept the option to have the 3D mammogram performed.  It both reduces the number of women who will be called back for extra views which then turn out to be normal, and it is better than the routine mammogram at detecting truly abnormal areas.    COLONOSCOPY:  Colonoscopy to screen for colon cancer is recommended for all women at age 48.  We know, you hate the idea of the prep.  We agree, BUT, having colon cancer and not knowing it is worse!!  Colon cancer so often starts as a polyp that can be seen and removed at colonscopy, which can quite literally save your life!  And if your first colonoscopy is normal and you have no family history of colon cancer, most women don't have to have it again for 10  years.  Once every ten years, you can do something that may end up saving your life, right?  We will be happy to help you get it scheduled when you are ready.  Be sure to check your insurance coverage so you understand how much it will cost.  It may be covered as a preventative service at no cost, but you should check your particular policy.      Overactive Bladder, Adult  Overactive bladder refers to a condition in which a person has a sudden need to pass urine. The person may leak urine if he or she cannot get to the bathroom fast enough  (urinary incontinence). A person with this condition may also wake up several times in the night to go to the bathroom. Overactive bladder is associated with poor nerve signals between your bladder and your brain. Your bladder may get the signal to empty before it is full. You may also have very sensitive muscles that make your bladder squeeze too soon. These symptoms might interfere with daily work or social activities. What are the causes? This condition may be associated with or caused by:  Urinary tract infection.  Infection of nearby tissues, such as the prostate.  Prostate enlargement.  Surgery on the uterus or urethra.  Bladder stones, inflammation, or tumors.  Drinking too much caffeine or alcohol.  Certain medicines, especially medicines that get rid of extra fluid in the body (diuretics).  Muscle or nerve weakness, especially from: ? A spinal cord injury. ? Stroke. ? Multiple sclerosis. ? Parkinson's disease.  Diabetes.  Constipation. What increases the risk? You may be at greater risk for overactive bladder if you:  Are an older adult.  Smoke.  Are going through menopause.  Have prostate problems.  Have a neurological disease, such as stroke, dementia, Parkinson's disease, or multiple sclerosis (MS).  Eat or drink things that irritate the bladder. These include alcohol, spicy food, and caffeine.  Are overweight or obese. What are the signs or symptoms? Symptoms of this condition include:  Sudden, strong urge to urinate.  Leaking urine.  Urinating 8 or more times a day.  Waking up to urinate 2 or more times a night. How is this diagnosed? Your health care provider may suspect overactive bladder based on your symptoms. He or she will diagnose this condition by:  A physical exam and medical history.  Blood or urine tests. You might need bladder or urine tests to help determine what is causing your overactive bladder. You might also need to see a  health care provider who specializes in urinary tract problems (urologist). How is this treated? Treatment for overactive bladder depends on the cause of your condition and whether it is mild or severe. You can also make lifestyle changes at home. Options include:  Bladder training. This may include: ? Learning to control the urge to urinate by following a schedule that directs you to urinate at regular intervals (timed voiding). ? Doing Kegel exercises to strengthen your pelvic floor muscles, which support your bladder. Toning these muscles can help you control urination, even if your bladder muscles are overactive.  Special devices. This may include: ? Biofeedback, which uses sensors to help you become aware of your body's signals. ? Electrical stimulation, which uses electrodes placed inside the body (implanted) or outside the body. These electrodes send gentle pulses of electricity to strengthen the nerves or muscles that control the bladder. ? Women may use a plastic device that fits into the vagina and  supports the bladder (pessary).  Medicines. ? Antibiotics to treat bladder infection. ? Antispasmodics to stop the bladder from releasing urine at the wrong time. ? Tricyclic antidepressants to relax bladder muscles. ? Injections of botulinum toxin type A directly into the bladder tissue to relax bladder muscles.  Lifestyle changes. This may include: ? Weight loss. Talk to your health care provider about weight loss methods that would work best for you. ? Diet changes. This may include reducing how much alcohol and caffeine you consume, or drinking fluids at different times of the day. ? Not smoking. Do not use any products that contain nicotine or tobacco, such as cigarettes and e-cigarettes. If you need help quitting, ask your health care provider.  Surgery. ? A device may be implanted to help manage the nerve signals that control urination. ? An electrode may be implanted to  stimulate electrical signals in the bladder. ? A procedure may be done to change the shape of the bladder. This is done only in very severe cases. Follow these instructions at home: Lifestyle  Make any diet or lifestyle changes that are recommended by your health care provider. These may include: ? Drinking less fluid or drinking fluids at different times of the day. ? Cutting down on caffeine or alcohol. ? Doing Kegel exercises. ? Losing weight if needed. ? Eating a healthy and balanced diet to prevent constipation. This may include:  Eating foods that are high in fiber, such as fresh fruits and vegetables, whole grains, and beans.  Limiting foods that are high in fat and processed sugars, such as fried and sweet foods. General instructions  Take over-the-counter and prescription medicines only as told by your health care provider.  If you were prescribed an antibiotic medicine, take it as told by your health care provider. Do not stop taking the antibiotic even if you start to feel better.  Use any implants or pessary as told by your health care provider.  If needed, wear pads to absorb urine leakage.  Keep a journal or log to track how much and when you drink and when you feel the need to urinate. This will help your health care provider monitor your condition.  Keep all follow-up visits as told by your health care provider. This is important. Contact a health care provider if:  You have a fever.  Your symptoms do not get better with treatment.  Your pain and discomfort get worse.  You have more frequent urges to urinate. Get help right away if:  You are not able to control your bladder. Summary  Overactive bladder refers to a condition in which a person has a sudden need to pass urine.  Several conditions may lead to an overactive bladder.  Treatment for overactive bladder depends on the cause and severity of your condition.  Follow your health care provider's  instructions about lifestyle changes, doing Kegel exercises, keeping a journal, and taking medicines. This information is not intended to replace advice given to you by your health care provider. Make sure you discuss any questions you have with your health care provider. Document Released: 07/14/2009 Document Revised: 01/08/2019 Document Reviewed: 10/03/2017 Elsevier Patient Education  2020 [employer].

## 2019-06-22 NOTE — Progress Notes (Signed)
73 y.o. G22P2002 Divorced Caucasian female here for annual exam. Patient complains of possible yeast under breasts   Working on weight loss.  Complaining of hair loss.   Had a stress test due to fatigue.  She is now using CPAP and feeling so much better.  Is even up less at night to void.   She uses Premarin vaginal cream and Ditropan XL.  Asking for refill of Premarin cream. (She is overdue for a mammogram.)  Lost a sister to CPOD and a brother to an MI in the last year.pne  PCP: Chesley Noon, MD    Patient's last menstrual period was 10/01/1994.           Sexually active: No.  The current method of family planning is status post hysterectomy.    Exercising: No.  The patient does not participate in regular exercise at present. Smoker:  no  Health Maintenance: Pap: Years ago History of abnormal Pap:  no MMG:  04/26/17 BIRADS 2 benign/density a.    Colonoscopy:  2009 negative. Patient declines.   BMD:   04/26/17  Result  Osteopenia TDaP:  10/02/11 Gardasil:   n/a HIV and Hep C: 04/18/17 Negative Screening Labs:  PCP   reports that she has never smoked. She has never used smokeless tobacco. She reports that she does not drink alcohol or use drugs.  Past Medical History:  Diagnosis Date  . Allergic rhinitis   . Asthma   . Depression   . Diverticulosis of colon (without mention of hemorrhage) 03/2008   Colonoscopy   . GERD (gastroesophageal reflux disease) 10/31/2012   Earlier Dx. with Asthma, but it was due to GERD by Dr. Asencion Noble  . Hip pain, acute 11/2012   Bursitis, got cortisone injection at Dr. Gladstone Lighter  . Hyperlipidemia   . Hypertension     Past Surgical History:  Procedure Laterality Date  . APPENDECTOMY    . HYSTEROSCOPY  1996   TAH secondary to Endometriosisand ovarian cyctectomy  . NECK SURGERY  2005   fusion C 4-5  . TOTAL KNEE ARTHROPLASTY Right 1996  . TOTAL SHOULDER ARTHROPLASTY Left 10/21/2015   Procedure: TOTAL SHOULDER ARTHROPLASTY;   Surgeon: Netta Cedars, MD;  Location: [facility];  Service: Orthopedics;  Laterality: Left;    Current Outpatient Medications  Medication Sig Dispense Refill  . acetaminophen (TYLENOL) 500 MG tablet Take 1,000 mg by mouth every 8 (eight) hours as needed for mild pain or moderate pain.    Marland Kitchen ALPRAZolam (XANAX) 0.5 MG tablet TAKE 1/2 TO 1 TABLET BY MOUTH TWICE DAILY AS NEEDED FOR ANXIETY OR SLEEP    . Alum Hydroxide-Mag Carbonate 160-105 MG CHEW Chew by mouth as needed.    Marland Kitchen amLODipine (NORVASC) 5 MG tablet Take 1 tablet (5 mg total) by mouth daily. 90 tablet 3  . Biotin 5000 MCG TABS Take 1 tablet by mouth daily.    . candesartan (ATACAND) 32 MG tablet TAKE 1 TABLET EVERY DAY    . conjugated estrogens (PREMARIN) vaginal cream Apply pea shaped amount as directed three times week.    . diphenhydrAMINE HCl (BENADRYL ALLERGY PO) Take 1 tablet by mouth daily.    Marland Kitchen escitalopram (LEXAPRO) 20 MG tablet Take by mouth.    Marland Kitchen omeprazole (PRILOSEC) 20 MG capsule Take 20 mg by mouth daily.    Marland Kitchen oxybutynin (DITROPAN-XL) 10 MG 24 hr tablet Take 10 mg by mouth daily.  0  . albuterol (PROVENTIL HFA;VENTOLIN HFA) 108 (90 Base) MCG/ACT inhaler  INHALE 2 PUFFS INTO THE LUNGS EVERY 4 HOURS AS NEEDED FOR WHEEZING     No current facility-administered medications for this visit.     Family History  Problem Relation Age of Onset  . Heart disease Mother   . Hypertension Mother   . Heart disease Father   . Asthma Sister   . Emphysema Sister        COPD  . Hypertension Sister   . Heart disease Maternal Grandmother   . CAD Brother     Review of Systems  Constitutional:       Odor under breasts Rash under breasts   HENT: Negative.   Eyes: Negative.   Respiratory: Negative.   Cardiovascular: Negative.   Gastrointestinal: Negative.   Endocrine: Negative.   Genitourinary: Negative.   Musculoskeletal: Negative.   Skin: Negative.   Allergic/Immunologic: Negative.   Neurological: Negative.   Hematological:  Negative.   Psychiatric/Behavioral: Negative.     Exam:   BP (!) 142/76 (BP Location: Left Arm, Patient Position: Sitting, Cuff Size: Large)   Pulse 80   Temp (!) 97.3 F (36.3 C) (Temporal)   Resp 14   Ht 5' 1.75" (1.568 m)   Wt 189 lb 6.4 oz (85.9 kg)   LMP 10/01/1994   BMI 34.92 kg/m     General appearance: alert, cooperative and appears stated age Head: normocephalic, without obvious abnormality, atraumatic Neck: no adenopathy, supple, symmetrical, trachea midline and thyroid normal to inspection and palpation Lungs: clear to auscultation bilaterally Breasts: normal appearance, no masses or tenderness, No nipple retraction or dimpling, No nipple discharge or bleeding, No axillary adenopathy Heart: regular rate and rhythm Abdomen: soft, non-tender; no masses, no organomegaly Extremities: extremities normal, atraumatic, no cyanosis or edema Skin: skin color, texture, turgor normal. No rashes or lesions Lymph nodes: cervical, supraclavicular, and axillary nodes normal. Neurologic: grossly normal  Pelvic: External genitalia:  Fusion of labia minora.  Beefy red change around the introitus.  No skin ulceration.               No abnormal inguinal nodes palpated.              Urethra:  normal appearing urethra with no masses, tenderness or lesions              Bartholins and Skenes: normal                 Vagina: normal appearing vagina with normal color and discharge, no lesions              Cervix:  Absent.              Pap taken: No. Bimanual Exam:  Uterus:   Absent.              Adnexa: no mass, fullness, tenderness              Rectal exam: Yes.  .  Confirms.              Anus:  normal sphincter tone,  hemorrhoid felt.  Chaperone was present for exam.  Assessment:   Well woman visit with normal exam. Status post hysterectomy and ovarian cystectomy.  Osteopenia. Fecal urgency.  Urinary urgency.  On Ditropan XL. Fused labia minora over clitoris.   Status post benign  vulvar biopsy.  First degree rectocele.  Hx yeast in flexural folds.   Plan: Mammogram screening discussed.  She will [facility].  Self breast awareness reviewed. Pap and  HR HPV as above. Guidelines for Calcium, Vitamin D, regular exercise program including cardiovascular and weight bearing exercise. Flu vaccine recommended.  BMD recommended.  Nystatin powder.   Labs and visit with PCP recommended. Follow up annually and prn.   After visit summary provided.

## 2019-07-14 ENCOUNTER — Encounter: Payer: Self-pay | Admitting: Obstetrics and Gynecology

## 2019-07-27 ENCOUNTER — Telehealth: Payer: Self-pay | Admitting: Obstetrics and Gynecology

## 2019-07-27 NOTE — Telephone Encounter (Signed)
Please contact patient regarding results of BMD from [facility] for 07/14/19.  She has osteopenia of the right hip.   The left hip and spine are normal. Her fracture risk is considered low enough that prescription medication is not indicated.   I recommend weight bearing exercise, calcium 1200 mg intake daily in divided doses, and vitamin D 600 - 800 IU daily.   She is due for her next bone density in 2 years.

## 2019-07-27 NOTE — Telephone Encounter (Signed)
Correction to calculated fracture risk from recent BMD.   Dawn Salinas risk of hip fracture is 3.7% over the next 10 years.   This is considered mildly elevated.   Her right hip is the weaker hip, and this may be due to her prior surgery and decreased use.   If she wishes to discuss therapy to reduce her risk of fracture, please schedule an appointment with me.

## 2019-08-06 NOTE — Telephone Encounter (Signed)
Left message to call Estill Bamberg, CMA. (patient has 2 notes regarding BMD from Dr.Silva).

## 2019-08-13 NOTE — Telephone Encounter (Signed)
Spoke with patient and reviewed BMD results. She voiced understanding. Made appointment to discuss further with Dr.Silva on 09-01-19 3:30pm

## 2019-08-13 NOTE — Telephone Encounter (Signed)
Discussed results with patient and made appointment to see Dr.Silva on 09-01-19 3:30pm.

## 2019-08-31 ENCOUNTER — Other Ambulatory Visit: Payer: Self-pay

## 2019-09-01 ENCOUNTER — Encounter: Payer: Self-pay | Admitting: Obstetrics and Gynecology

## 2019-09-01 ENCOUNTER — Ambulatory Visit (INDEPENDENT_AMBULATORY_CARE_PROVIDER_SITE_OTHER): Payer: Medicare HMO | Admitting: Obstetrics and Gynecology

## 2019-09-01 VITALS — BP 146/68 | HR 70 | Temp 97.2°F | Ht 61.75 in | Wt 192.0 lb

## 2019-09-01 DIAGNOSIS — N763 Subacute and chronic vulvitis: Secondary | ICD-10-CM | POA: Diagnosis not present

## 2019-09-01 DIAGNOSIS — M8580 Other specified disorders of bone density and structure, unspecified site: Secondary | ICD-10-CM

## 2019-09-01 DIAGNOSIS — R300 Dysuria: Secondary | ICD-10-CM | POA: Diagnosis not present

## 2019-09-01 LAB — POCT URINALYSIS DIPSTICK
Bilirubin, UA: NEGATIVE
Blood, UA: NEGATIVE
Glucose, UA: NEGATIVE
Ketones, UA: NEGATIVE
Leukocytes, UA: NEGATIVE
Nitrite, UA: NEGATIVE
Protein, UA: NEGATIVE
Urobilinogen, UA: 0.2 E.U./dL
pH, UA: 5 (ref 5.0–8.0)

## 2019-09-01 MED ORDER — RISEDRONATE SODIUM 35 MG PO TABS: 35.0000 mg | ORAL_TABLET | ORAL | 2 refills | Status: DC

## 2019-09-01 MED ORDER — BETAMETHASONE VALERATE 0.1 % EX OINT: 1.0000 "application " | TOPICAL_OINTMENT | Freq: Two times a day (BID) | CUTANEOUS | 1 refills | Status: DC

## 2019-09-01 NOTE — Progress Notes (Signed)
GYNECOLOGY  VISIT   HPI: 73 y.o.   Divorced  Caucasian  female   G2P2002 with Patient's last menstrual period was 10/01/1994. here to discuss bone density results from [facility] 07/14/19. T score right hip -2.0. Left hip and spine normal. FRAX - hip fracture risk 3.7% and major osteoporotic fracture risk 18%.  Denies smoking or ETOH use.   States she has hand problems.   Patient requesting urine dip due to dysuria.  She would like to discuss her vulvar irritation and lichen sclerosus.   States she got shingles following the shingles vaccine.   Asking about physical therapy for balance issues.  She is taking Gaviscon for some heartburn.   Urine Dip:  Negative.  GYNECOLOGIC HISTORY: Patient's last menstrual period was 10/01/1994. Contraception:  Hysterectomy Menopausal hormone therapy:  Last mammogram: 07-14-19 3D/Neg/density A/BiRads1 Last pap smear:  Years ago normal        OB History    Gravida  2   Para  2   Term  2   Preterm  0   AB  0   Living  2     SAB  0   TAB  0   Ectopic  0   Multiple  0   Live Births  0              Patient Active Problem List   Diagnosis Date Noted  . 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 Urinary, incontinence, stress female 12/18/2012  . Urge and stress incontinence 11/10/2012  . Situational anxiety 11/10/2012  . 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 ABNORMAL ELECTROCARDIOGRAM 09/14/2010  . FIBROMYALGIA 09/13/2010  . ABDOMINAL PAIN, GENERALIZED 08/22/2010  . HYPERCHOLESTEROLEMIA 08/17/2010  . DEPRESSION 08/17/2010  . HYPERTENSION 08/17/2010  . UMB HERNIA WITHOUT MENTION OBSTRUCTION/GANGRENE 08/17/2010  . DIVERTICULOSIS, COLON 08/17/2010  . FATIGUE 08/17/2010  . NAUSEA AND VOMITING 08/17/2010  . ABDOMINAL PAIN 08/17/2010  . PRE-DIABETES 08/17/2010    Past Medical History:  Diagnosis Date  . Allergic rhinitis   . Asthma   . Depression   . Diverticulosis of  colon (without mention of hemorrhage) 03/2008   Colonoscopy   . GERD (gastroesophageal reflux disease) 10/31/2012   Earlier Dx. with Asthma, but it was due to GERD by Dr. Asencion Noble  . Hip pain, acute 11/2012   Bursitis, got cortisone injection at Dr. Gladstone Lighter  . Hyperlipidemia   . Hypertension     Past Surgical History:  Procedure Laterality Date  . APPENDECTOMY    . HYSTEROSCOPY  1996   TAH secondary to Endometriosisand ovarian cyctectomy  . NECK SURGERY  2005   fusion C 4-5  . TOTAL KNEE ARTHROPLASTY Right 1996  . TOTAL SHOULDER ARTHROPLASTY Left 10/21/2015   Procedure: TOTAL SHOULDER ARTHROPLASTY;  Surgeon: Netta Cedars, MD;  Location: [facility];  Service: Orthopedics;  Laterality: Left;    Current Outpatient Medications  Medication Sig Dispense Refill  . acetaminophen (TYLENOL) 500 MG tablet Take 1,000 mg by mouth every 8 (eight) hours as needed for mild pain or moderate pain.    Marland Kitchen albuterol (PROVENTIL HFA;VENTOLIN HFA) 108 (90 Base) MCG/ACT inhaler INHALE 2 PUFFS INTO THE LUNGS EVERY 4 HOURS AS NEEDED FOR WHEEZING    . ALPRAZolam (XANAX) 0.5 MG tablet TAKE 1/2 TO 1 TABLET BY MOUTH TWICE DAILY AS NEEDED FOR ANXIETY OR SLEEP    . Alum Hydroxide-Mag Carbonate 160-105 MG CHEW Chew by mouth as needed.    Marland Kitchen  amLODipine (NORVASC) 5 MG tablet Take 1 tablet (5 mg total) by mouth daily. 90 tablet 3  . Biotin 5000 MCG TABS Take 1 tablet by mouth daily.    . calcium-vitamin D (OSCAL WITH D) 250-125 MG-UNIT tablet Take by mouth.    . candesartan (ATACAND) 32 MG tablet TAKE 1 TABLET EVERY DAY    . diphenhydrAMINE HCl (BENADRYL ALLERGY PO) Take 1 tablet by mouth daily.    Marland Kitchen escitalopram (LEXAPRO) 20 MG tablet Take by mouth.    . nystatin (MYCOSTATIN/NYSTOP) powder Apply topically 3 (three) times daily. Apply to affected area for up to 7 days 30 g 2  . omeprazole (PRILOSEC) 20 MG capsule Take 20 mg by mouth daily.    Marland Kitchen oxybutynin (DITROPAN-XL) 10 MG 24 hr tablet Take 10 mg by mouth daily.  0  .  traMADol (ULTRAM) 50 MG tablet Take 1 tablet by mouth as needed.    . Vitamin D, Ergocalciferol, (DRISDOL) 1.25 MG (50000 UT) CAPS capsule Take by mouth.    . betamethasone valerate ointment (VALISONE) 0.1 % Apply 1 application topically 2 (two) times daily. Use for 4 weeks and then reduce to using twice a week at bedtime. 30 g 1  . risedronate (ACTONEL) 35 MG tablet Take 1 tablet (35 mg total) by mouth every 7 (seven) days. with water on empty stomach, nothing by mouth or lie down for next 30 minutes. 4 tablet 2   No current facility-administered medications for this visit.      ALLERGIES: Mirabegron, Cephalexin, and Codeine  Family History  Problem Relation Age of Onset  . Heart disease Mother   . Hypertension Mother   . Heart disease Father   . Asthma Sister   . Emphysema Sister        COPD  . Hypertension Sister   . Heart disease Maternal Grandmother   . CAD Brother     Social History   Socioeconomic History  . Marital status: Divorced    Spouse name: Everly Burdick  . Number of children: 2  . Years of education: 75  . Highest education level: Not on file  Occupational History  . Occupation: Retired Mining engineer: RETIRED  Social Needs  . Financial resource strain: Not on file  . Food insecurity    Worry: Not on file    Inability: Not on file  . Transportation needs    Medical: Not on file    Non-medical: Not on file  Tobacco Use  . Smoking status: Never Smoker  . Smokeless tobacco: Never Used  Substance and Sexual Activity  . Alcohol use: No    Alcohol/week: 0.0 standard drinks  . Drug use: No  . Sexual activity: Not Currently    Partners: Male    Birth control/protection: Surgical    Comment: Hyst  Lifestyle  . Physical activity    Days per week: Not on file    Minutes per session: Not on file  . Stress: Not on file  Relationships  . Social Herbalist on phone: Not on file    Gets together: Not on file    Attends religious service: Not  on file    Active member of club or organization: Not on file    Attends meetings of clubs or organizations: Not on file    Relationship status: Not on file  . Intimate partner violence    Fear of current or ex partner: Not on file  Emotionally abused: Not on file    Physically abused: Not on file    Forced sexual activity: Not on file  Other Topics Concern  . Not on file  Social History Narrative  . Not on file    Review of Systems  See HPI.   PHYSICAL EXAMINATION:    BP (!) 146/68 (Cuff Size: Large)   Pulse 70   Temp (!) 97.2 F (36.2 C) (Temporal)   Ht 5' 1.75" (1.568 m)   Wt 192 lb (87.1 kg)   LMP 10/01/1994   BMI 35.40 kg/m     General appearance: alert, cooperative and appears stated age   Vulva:  Fusion of the labia minora and some increase pink change around introitus.  No ecchymoses or ulceration.  Urethra:  Normal with no masses or tenderness. Vagina:  No lesions.  Cervix:  Absent.  Uterus:  Absent.  Adnexa:  No masses or tenderness.   ASSESSMENT  Osteopenia.  Increased risk of fracture.  Vulvitis.   Looks consistent with lichen sclerosus.  Benign vulvar biopsy on chart review.  PLAN  We discussed osteopenia and osteoporosis, risk factors, increased risk of fracture and treatment options.  I reviewed bisphosphonates, Evista, Reclast, and Prolia - risks and benefits.  She will start Actonel 35 mg weekly.  She is instructed in use.  Calcium, vit D, and weight bearing exercise discussed.  We discussed how to reduce risk of falls.  Next BMD in 2 years.  Rx for Valisone ointment.  Instructed in use.  FU in 5 weeks for a recheck.  She will follow up with her PCP if she would like physical therapy.  An After Visit Summary was printed and given to the patient.  ___25___ minutes face to face time of which over 50% was spent in counseling.

## 2019-09-01 NOTE — Patient Instructions (Addendum)
Osteopenia  Osteopenia is a loss of thickness (density) inside of the bones. Another name for osteopenia is low bone mass. Mild osteopenia is a normal part of aging. It is not a disease, and it does not cause symptoms. However, if you have osteopenia and continue to lose bone mass, you could develop a condition that causes the bones to become thin and break more easily (osteoporosis). You may also lose some height, have back pain, and have a stooped posture. Although osteopenia is not a disease, making changes to your lifestyle and diet can help to prevent osteopenia from developing into osteoporosis. What are the causes? Osteopenia is caused by loss of calcium in the bones.  Bones are constantly changing. Old bone cells are continually being replaced with new bone cells. This process builds new bone. The mineral calcium is needed to build new bone and maintain bone density. Bone density is usually highest around age 57. After that, most people's bodies cannot replace all the bone they have lost with new bone. What increases the risk? You are more likely to develop this condition if:  You are older than age 63.  You are a woman who went through menopause early.  You have a long illness that keeps you in bed.  You do not get enough exercise.  You lack certain nutrients (malnutrition).  You have an overactive thyroid gland (hyperthyroidism).  You smoke.  You drink a lot of alcohol.  You are taking medicines that weaken the bones, such as steroids. What are the signs or symptoms? This condition does not cause any symptoms. You may have a slightly higher risk for bone breaks (fractures), so getting fractures more easily than normal may be an indication of osteopenia. How is this diagnosed? Your health care provider can diagnose this condition with a special type of X-ray exam that measures bone density (dual-energy X-ray absorptiometry, DEXA). This test can measure bone density in your  hips, spine, and wrists. Osteopenia has no symptoms, so this condition is usually diagnosed after a routine bone density screening test is done for osteoporosis. This routine screening is usually done for:  Women who are age 29 or older.  Men who are age 87 or older. If you have risk factors for osteopenia, you may have the screening test at an earlier age. How is this treated? Making dietary and lifestyle changes can lower your risk for osteoporosis. If you have severe osteopenia that is close to becoming osteoporosis, your health care provider may prescribe medicines and dietary supplements such as calcium and vitamin D. These supplements help to rebuild bone density. Follow these instructions at home:   Take over-the-counter and prescription medicines only as told by your health care provider. These include vitamins and supplements.  Eat a diet that is high in calcium and vitamin D. ? Calcium is found in dairy products, beans, salmon, and leafy green vegetables like spinach and broccoli. ? Look for foods that have vitamin D and calcium added to them (fortified foods), such as orange juice, cereal, and bread.  Do 30 or more minutes of a weight-bearing exercise every day, such as walking, jogging, or playing a sport. These types of exercises strengthen the bones.  Take precautions at home to lower your risk of falling, such as: ? Keeping rooms well-lit and free of clutter, such as cords. ? Installing safety rails on stairs. ? Using rubber mats in the bathroom or other areas that are often wet or slippery.  Do not use  any products that contain nicotine or tobacco, such as cigarettes and e-cigarettes. If you need help quitting, ask your health care provider.  Avoid alcohol or limit alcohol intake to no more than 1 drink a day for nonpregnant women and 2 drinks a day for men. One drink equals 12 oz of beer, 5 oz of wine, or 1 oz of hard liquor.  Keep all follow-up visits as told by your  health care provider. This is important. Contact a health care provider if:  You have not had a bone density screening for osteoporosis and you are: ? A woman, age 94 or older. ? A man, age 90 or older.  You are a postmenopausal woman who has not had a bone density screening for osteoporosis.  You are older than age 83 and you want to know if you should have bone density screening for osteoporosis. Summary  Osteopenia is a loss of thickness (density) inside of the bones. Another name for osteopenia is low bone mass.  Osteopenia is not a disease, but it may increase your risk for a condition that causes the bones to become thin and break more easily (osteoporosis).  You may be at risk for osteopenia if you are older than age 54 or if you are a woman who went through early menopause.  Osteopenia does not cause any symptoms, but it can be diagnosed with a bone density screening test.  Dietary and lifestyle changes are the first treatment for osteopenia. These may lower your risk for osteoporosis. This information is not intended to replace advice given to you by your health care provider. Make sure you discuss any questions you have with your health care provider. Document Released: 06/26/2017 Document Revised: 08/30/2017 Document Reviewed: 06/26/2017 Elsevier Patient Education  2020 [place]. Risedronate tablets What is this medicine? RISEDRONATE (ris ED roe nate) reduces calcium loss from bones. It helps make healthy bone and to slow bone loss in patients with Paget's disease and osteoporosis. It may be used in others at risk for bone loss. This medicine may be used for other purposes; ask your health care provider or pharmacist if you have questions. COMMON BRAND NAME(S): Actonel What should I tell my health care provider before I take this medicine? They need to know if you have any of these conditions:  dental disease  esophagus, stomach, or intestine problems, like acid  reflux or GERD  kidney disease  low blood calcium  problems sitting or standing for 30 minutes  trouble swallowing  an unusual or allergic reaction to risedronate, other medicines, foods, dyes, or preservatives  pregnant or trying to get pregnant  breast-feeding How should I use this medicine? You must take this medication exactly as directed or you will lower the amount of medicine you absorb into your body or you may cause your self harm. Take this medicine by mouth first thing in the morning, after you are up for the day. Do not eat or drink anything before you take this medicine. Swallow the tablets with a full glass (6 to 8 fluid ounces) of plain water. Do not take the tablets with any other drink. Do not chew or crush the tablet. After taking this medicine, do not eat breakfast, drink, or take any other medicines or vitamins for at least 30 minutes. Stand or sit up for at least 30 minutes after you take this medicine; do not lie down. Do not take your medicine more often than directed. Talk to your pediatrician regarding the  use of this medicine in children. Special care may be needed. Overdosage: If you think you have taken too much of this medicine contact a poison control center or emergency room at once. NOTE: This medicine is only for you. Do not share this medicine with others. What if I miss a dose? If you miss a dose, do not take it later in the day. Take your normal dose the next morning. Do not take double or extra doses. What may interact with this medicine?  antacids like aluminum hydroxide or magnesium hydroxide  aspirin  calcium supplements  iron supplements  NSAIDs, medicines for pain and inflammation, like ibuprofen or naproxen  thyroid hormones  vitamins with minerals This list may not describe all possible interactions. Give your health care provider a list of all the medicines, herbs, non-prescription drugs, or dietary supplements you use. Also tell them  if you smoke, drink alcohol, or use illegal drugs. Some items may interact with your medicine. What should I watch for while using this medicine? Visit your doctor or health care professional for regular check ups. It may be some time before you see the benefit from this medicine. Your doctor or health care professional may order blood tests and other tests to see how you are doing. You should make sure you get enough calcium and vitamin D while you are taking this medicine, unless your doctor tells you not to. Discuss the foods you eat and the vitamins you take with your health care professional. Some people who take this medicine have severe bone, joint, and/or muscle pain. This medicine may also increase your risk for a broken thigh bone. Tell your doctor right away if you have pain in your upper leg or groin. Tell your doctor if you have any pain that does not go away or that gets worse. What side effects may I notice from receiving this medicine? Side effects that you should report to your doctor or health care professional as soon as possible:  allergic reactions such as skin rash or itching, hives, swelling of the face, lips, throat, or tongue  black or tarry stools  changes in vision  heartburn or stomach pain  jaw pain, especially after dental work  pain or difficulty when swallowing  redness, blistering, peeling, or loosening of the skin, including inside the mouth Side effects that usually do not require medical attention (report to your doctor or health care professional if they continue or are bothersome):  bone, muscle, or joint pain  changes in taste  diarrhea or constipation  eye pain or itching  headache  nausea or vomiting  stomach gas or fullness This list may not describe all possible side effects. Call your doctor for medical advice about side effects. You may report side effects to FDA at 1-800-FDA-1088. Where should I keep my medicine? Keep out of the  reach of children. Store at room temperature between 20 and 25 degrees C (68 and 77 degrees F). Throw away any unused medicine after the expiration date. NOTE: This sheet is a summary. It may not cover all possible information. If you have questions about this medicine, talk to your doctor, pharmacist, or health care provider.  2020 Elsevier/Gold Standard (123XX123 [id number]) Lichen Sclerosus Lichen sclerosus is a skin problem. It can happen on any part of the body, but it commonly involves the anal or genital areas. It can cause itching and discomfort in these areas. Treatment can help to control symptoms. When the genital area is affected,  getting treatment is important because the condition can cause scarring that may lead to other problems. What are the causes? The cause of this condition is not known. It may be related to an overactive immune system or a lack of certain hormones. Lichen sclerosus is not an infection or a fungus, and it is not passed from one person to another (not contagious). What increases the risk? This condition is more likely to develop in women, usually after menopause. What are the signs or symptoms? Symptoms of this condition include:  Thin, wrinkled, white areas on the skin.  Thickened white areas on the skin.  Red and swollen patches (lesions) on the skin.  Tears or cracks in the skin.  Bruising.  Blood blisters.  Severe itching.  Pain, itching, or burning when urinating. Constipation is also common in people with lichen sclerosus. How is this diagnosed? This condition may be diagnosed with a physical exam. In some cases, a tissue sample (biopsy sample) may be removed to be looked at under a microscope. How is this treated? This condition is usually treated with medicated creams or ointments (topical steroids) that are applied over the affected areas. In some cases, treatment may also include medicines that are taken by mouth. Surgery may be needed in  more severe cases that are causing problems such as scarring. Follow these instructions at home:  Take or use over-the-counter and prescription medicines only as told by your health care provider.  Use creams or ointments as told by your health care provider.  Do not scratch the affected areas of skin.  If you are a woman, be sure to keep the vaginal area as clean and dry as possible.  Clean the affected area of skin gently with water. Avoid using rough towels or toilet paper.  Keep all follow-up visits as told by your health care provider. This is important. Contact a health care provider if:  You have increasing redness, swelling, or pain in the affected area.  You have fluid, blood, or pus coming from the affected area.  You have new lesions on your skin.  You have a fever.  You have pain during sex. Summary  Lichen sclerosus is a skin problem. When the genital area is affected, getting treatment is important because the condition can cause scarring that may lead to other problems.  This condition is usually treated with medicated creams or ointments (topical steroids) that are applied over the affected areas.  Take or use over-the-counter and prescription medicines only as told by your health care provider.  Contact a health care provider if you have new lesions on your skin, have pain during sex, or have increasing redness, swelling, or pain in the affected area.  Keep all follow-up visits as told by your health care provider. This is important. This information is not intended to replace advice given to you by your health care provider. Make sure you discuss any questions you have with your health care provider. Document Released: 02/07/2011 Document Revised: 01/30/2018 Document Reviewed: 01/30/2018 Elsevier Patient Education  2020 [employer].

## 2019-10-08 ENCOUNTER — Ambulatory Visit: Payer: Medicare HMO | Admitting: Obstetrics and Gynecology

## 2019-10-27 NOTE — Progress Notes (Signed)
Virtual Visit via Video Note changed to phone visit at patient request.   This visit type was conducted due to national recommendations for restrictions regarding the COVID-19 Pandemic (e.g. social distancing) in an effort to limit this patient's exposure and mitigate transmission in our community.  Due to her co-morbid illnesses, this patient is at least at moderate risk for complications without adequate follow up.  This format is felt to be most appropriate for this patient at this time.  All issues noted in this document were discussed and addressed.  A limited physical exam was performed with this format.  Please refer to the patient's chart for her consent to telehealth for [facility].   Date:  10/30/2019   ID:  Dawn Salinas, DOB 1946-06-12, MRN [id number]  Patient Location:Home Provider Location: Home  PCP:  Chesley Noon, MD  Cardiologist:  Dr Stanford Breed  Evaluation Performed:  Follow-Up Visit  Chief Complaint:  FU CP.   History of Present Illness:    By report she has had 2 prior catheterizations in 1999 and 2000 which were normal but no records available. Stress echocardiogram January 2012 normal. Stress nuclear study July 2020 showed ejection fraction 70% with normal perfusion. Since last seen she denies dyspnea or syncope; occasional chest tightness following activities unchanged; no exertional CP.  The patient does not have symptoms concerning for COVID-19 infection (fever, chills, cough, or new shortness of breath).    Past Medical History:  Diagnosis Date  . Allergic rhinitis   . Asthma   . Depression   . Diverticulosis of colon (without mention of hemorrhage) 03/2008   Colonoscopy   . GERD (gastroesophageal reflux disease) 10/31/2012   Earlier Dx. with Asthma, but it was due to GERD by Dr. Asencion Noble  . Hip pain, acute 11/2012   Bursitis, got cortisone injection at Dr. Gladstone Lighter  . Hyperlipidemia   . Hypertension    Past Surgical History:  Procedure  Laterality Date  . APPENDECTOMY    . HYSTEROSCOPY  1996   TAH secondary to Endometriosisand ovarian cyctectomy  . NECK SURGERY  2005   fusion C 4-5  . TOTAL KNEE ARTHROPLASTY Right 1996  . TOTAL SHOULDER ARTHROPLASTY Left 10/21/2015   Procedure: TOTAL SHOULDER ARTHROPLASTY;  Surgeon: Netta Cedars, MD;  Location: [place];  Service: Orthopedics;  Laterality: Left;     Current Meds  Medication Sig  . acetaminophen (TYLENOL) 500 MG tablet Take 1,000 mg by mouth every 8 (eight) hours as needed for mild pain or moderate pain.  Marland Kitchen albuterol (PROVENTIL HFA;VENTOLIN HFA) 108 (90 Base) MCG/ACT inhaler INHALE 2 PUFFS INTO THE LUNGS EVERY 4 HOURS AS NEEDED FOR WHEEZING  . ALPRAZolam (XANAX) 0.5 MG tablet TAKE 1/2 TO 1 TABLET BY MOUTH TWICE DAILY AS NEEDED FOR ANXIETY OR SLEEP  . Alum Hydroxide-Mag Carbonate 160-105 MG CHEW Chew by mouth as needed.  Marland Kitchen amLODipine (NORVASC) 5 MG tablet Take 1 tablet (5 mg total) by mouth daily.  . betamethasone valerate ointment (VALISONE) 0.1 % Apply 1 application topically 2 (two) times daily. Use for 4 weeks and then reduce to using twice a week at bedtime.  . calcium-vitamin D (OSCAL WITH D) 250-125 MG-UNIT tablet Take by mouth.  . candesartan (ATACAND) 32 MG tablet TAKE 1 TABLET EVERY DAY  . diphenhydrAMINE HCl (BENADRYL ALLERGY PO) Take 1 tablet by mouth daily.  Marland Kitchen escitalopram (LEXAPRO) 20 MG tablet Take by mouth.  Marland Kitchen omeprazole (PRILOSEC) 20 MG capsule Take 20 mg by mouth daily.  Marland Kitchen  oxybutynin (DITROPAN-XL) 10 MG 24 hr tablet Take 10 mg by mouth daily.  . risedronate (ACTONEL) 35 MG tablet Take 1 tablet (35 mg total) by mouth every 7 (seven) days. with water on empty stomach, nothing by mouth or lie down for next 30 minutes.  . traMADol (ULTRAM) 50 MG tablet Take 1 tablet by mouth as needed.  . [DISCONTINUED] Biotin 5000 MCG TABS Take 1 tablet by mouth daily.     Allergies:   Mirabegron, Cephalexin, and Codeine   Social History   Tobacco Use  . Smoking status:  Never Smoker  . Smokeless tobacco: Never Used  Substance Use Topics  . Alcohol use: No    Alcohol/week: 0.0 standard drinks  . Drug use: No     Family Hx: The patient's family history includes Asthma in her sister; CAD in her brother; Emphysema in her sister; Heart disease in her father, maternal grandmother, and mother; Hypertension in her mother and sister.  ROS:   Please see the history of present illness.    Recent cold symptoms All other systems reviewed and are negative.   Wt Readings from Last 3 Encounters:  10/30/19 190 lb (86.2 kg)  09/01/19 192 lb (87.1 kg)  06/22/19 189 lb 6.4 oz (85.9 kg)     Objective:    Vital Signs:  BP (!) 149/87   Pulse 85   Ht 5\' 4"  (1.626 m)   Wt 190 lb (86.2 kg)   LMP 10/01/1994   BMI 32.61 kg/m    VITAL SIGNS:  reviewed NAD Answers questions appropriately Normal affect Remainder of physical examination not performed (telehealth visit; coronavirus pandemic)  ASSESSMENT & PLAN:    1. Chest pain-no exertional CP; recent nuclear study neg; no plans for further evaluation at this time. 2. Hypertension-blood pressure elevated; however she has not taken her medications this morning.  She will continue present regimen and we will follow. 3. Hyperlipidemia-intolerant to statins. Per primary care.   COVID-19 Education: The importance of social distancing was discussed today.  Time:   Today, I have spent 16 minutes with the patient with telehealth technology discussing the above problems.     Medication Adjustments/Labs and Tests Ordered: Current medicines are reviewed at length with the patient today.  Concerns regarding medicines are outlined above.   Tests Ordered: No orders of the defined types were placed in this encounter.   Medication Changes: No orders of the defined types were placed in this encounter.   Follow Up:  Either In Person or Virtual in 1 year(s)  Signed, Kirk Ruths, MD  10/30/2019 9:48 AM    [place]

## 2019-10-30 ENCOUNTER — Telehealth (INDEPENDENT_AMBULATORY_CARE_PROVIDER_SITE_OTHER): Payer: Medicare HMO | Admitting: Cardiology

## 2019-10-30 ENCOUNTER — Encounter: Payer: Self-pay | Admitting: Cardiology

## 2019-10-30 ENCOUNTER — Encounter: Payer: Self-pay | Admitting: *Deleted

## 2019-10-30 VITALS — BP 149/87 | HR 85 | Ht 64.0 in | Wt 190.0 lb

## 2019-10-30 DIAGNOSIS — R072 Precordial pain: Secondary | ICD-10-CM | POA: Diagnosis not present

## 2019-10-30 DIAGNOSIS — E7800 Pure hypercholesterolemia, unspecified: Secondary | ICD-10-CM

## 2019-10-30 DIAGNOSIS — I10 Essential (primary) hypertension: Secondary | ICD-10-CM

## 2019-10-30 NOTE — Patient Instructions (Signed)

## 2019-11-17 ENCOUNTER — Ambulatory Visit: Payer: Medicare HMO | Admitting: Obstetrics and Gynecology

## 2019-11-17 ENCOUNTER — Telehealth: Payer: Self-pay | Admitting: Obstetrics and Gynecology

## 2019-11-17 NOTE — Telephone Encounter (Signed)
Patient cancelled and reschedule 5 week recheck today because she is sick. Rescheduled to Monday 11/23/19.

## 2019-11-17 NOTE — Telephone Encounter (Signed)
Encounter reviewed and closed.  

## 2019-11-21 ENCOUNTER — Other Ambulatory Visit: Payer: Self-pay | Admitting: Cardiology

## 2019-11-21 DIAGNOSIS — R072 Precordial pain: Secondary | ICD-10-CM

## 2019-11-23 ENCOUNTER — Ambulatory Visit (INDEPENDENT_AMBULATORY_CARE_PROVIDER_SITE_OTHER): Payer: Medicare HMO | Admitting: Obstetrics and Gynecology

## 2019-11-23 ENCOUNTER — Encounter: Payer: Self-pay | Admitting: Obstetrics and Gynecology

## 2019-11-23 ENCOUNTER — Other Ambulatory Visit: Payer: Self-pay

## 2019-11-23 VITALS — BP 138/64 | HR 68 | Temp 97.0°F | Resp 14 | Ht 64.0 in | Wt 188.0 lb

## 2019-11-23 DIAGNOSIS — N763 Subacute and chronic vulvitis: Secondary | ICD-10-CM | POA: Diagnosis not present

## 2019-11-23 DIAGNOSIS — M8580 Other specified disorders of bone density and structure, unspecified site: Secondary | ICD-10-CM | POA: Diagnosis not present

## 2019-11-23 DIAGNOSIS — N3281 Overactive bladder: Secondary | ICD-10-CM

## 2019-11-23 MED ORDER — BETAMETHASONE VALERATE 0.1 % EX OINT: 1.0000 "application " | TOPICAL_OINTMENT | Freq: Two times a day (BID) | CUTANEOUS | 0 refills | Status: DC

## 2019-11-23 MED ORDER — RISEDRONATE SODIUM 35 MG PO TABS: 35.0000 mg | ORAL_TABLET | ORAL | 3 refills | Status: DC

## 2019-11-23 NOTE — Patient Instructions (Signed)

## 2019-11-23 NOTE — Progress Notes (Signed)
GYNECOLOGY  VISIT   HPI: 74 y.o.   Divorced  Caucasian  female   G2P2002 with Patient's last menstrual period was 10/01/1994.   here for 5 week follow up.  She is using Valisone for vulvitis and changes looking consistent with lichen sclerosus.  Her prior biopsy of the vulva is benign on chart review.   She started Actonel 35 mg weekly four weeks ago due to osteopenia and increased risk of fracture.  She has sometimes been taking it with coffee.  She denies problems with her medication.   She takes her Oscal off and on.   Worried about her weight gain and fatty liver.   Her vulva is feeling much better.  She is forgetting to use the Valisone.   She has an appointment with urogynecologist in March.  She has been on Oxybutynin and it is not working well.  She is losing control when she gets up at night to use the bathroom.  She uses lemon "on everything".  Using caffeine also.   GYNECOLOGIC HISTORY: Patient's last menstrual period was 10/01/1994. Contraception:  hysterectomy Menopausal hormone therapy:  none Last mammogram:  07-14-2019 density A/BIRADS 1 negative  Last pap smear:   Years ago         OB History    Gravida  2   Para  2   Term  2   Preterm  0   AB  0   Living  2     SAB  0   TAB  0   Ectopic  0   Multiple  0   Live Births  0              Patient Active Problem List   Diagnosis Date Noted  . 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 Urinary, incontinence, stress female 12/18/2012  . Urge and stress incontinence 11/10/2012  . Situational anxiety 11/10/2012  . 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 ABNORMAL ELECTROCARDIOGRAM 09/14/2010  . FIBROMYALGIA 09/13/2010  . ABDOMINAL PAIN, GENERALIZED 08/22/2010  . HYPERCHOLESTEROLEMIA 08/17/2010  . DEPRESSION 08/17/2010  . HYPERTENSION 08/17/2010  . UMB HERNIA WITHOUT MENTION OBSTRUCTION/GANGRENE 08/17/2010  . DIVERTICULOSIS, COLON 08/17/2010   . FATIGUE 08/17/2010  . NAUSEA AND VOMITING 08/17/2010  . ABDOMINAL PAIN 08/17/2010  . PRE-DIABETES 08/17/2010    Past Medical History:  Diagnosis Date  . Allergic rhinitis   . Asthma   . Depression   . Diverticulosis of colon (without mention of hemorrhage) 03/2008   Colonoscopy   . GERD (gastroesophageal reflux disease) 10/31/2012   Earlier Dx. with Asthma, but it was due to GERD by Dr. Asencion Noble  . Hip pain, acute 11/2012   Bursitis, got cortisone injection at Dr. Gladstone Lighter  . Hyperlipidemia   . Hypertension     Past Surgical History:  Procedure Laterality Date  . APPENDECTOMY    . HYSTEROSCOPY  1996   TAH secondary to Endometriosisand ovarian cyctectomy  . NECK SURGERY  2005   fusion C 4-5  . TOTAL KNEE ARTHROPLASTY Right 1996  . TOTAL SHOULDER ARTHROPLASTY Left 10/21/2015   Procedure: TOTAL SHOULDER ARTHROPLASTY;  Surgeon: Netta Cedars, MD;  Location: [place];  Service: Orthopedics;  Laterality: Left;    Current Outpatient Medications  Medication Sig Dispense Refill  . acetaminophen (TYLENOL) 500 MG tablet Take 1,000 mg by mouth every 8 (eight) hours as needed for mild pain or moderate pain.    Marland Kitchen albuterol (PROVENTIL HFA;VENTOLIN HFA) 108 (  90 Base) MCG/ACT inhaler INHALE 2 PUFFS INTO THE LUNGS EVERY 4 HOURS AS NEEDED FOR WHEEZING    . ALPRAZolam (XANAX) 0.5 MG tablet TAKE 1/2 TO 1 TABLET BY MOUTH TWICE DAILY AS NEEDED FOR ANXIETY OR SLEEP    . Alum Hydroxide-Mag Carbonate 160-105 MG CHEW Chew by mouth as needed.    Marland Kitchen amLODipine (NORVASC) 5 MG tablet Take 1 tablet (5 mg total) by mouth daily. 90 tablet 3  . betamethasone valerate ointment (VALISONE) 0.1 % Apply 1 application topically 2 (two) times daily. Use for 4 weeks and then reduce to using twice a week at bedtime. 30 g 1  . calcium-vitamin D (OSCAL WITH D) 250-125 MG-UNIT tablet Take by mouth.    . candesartan (ATACAND) 32 MG tablet TAKE 1 TABLET EVERY DAY    . diphenhydrAMINE HCl (BENADRYL ALLERGY PO) Take 1  tablet by mouth daily.    Marland Kitchen escitalopram (LEXAPRO) 20 MG tablet Take by mouth.    Marland Kitchen omeprazole (PRILOSEC) 20 MG capsule Take 20 mg by mouth daily.    Marland Kitchen oxybutynin (DITROPAN-XL) 10 MG 24 hr tablet Take 10 mg by mouth daily.  0  . risedronate (ACTONEL) 35 MG tablet Take 1 tablet (35 mg total) by mouth every 7 (seven) days. with water on empty stomach, nothing by mouth or lie down for next 30 minutes. 4 tablet 2  . traMADol (ULTRAM) 50 MG tablet Take 1 tablet by mouth as needed.     No current facility-administered medications for this visit.     ALLERGIES: Mirabegron, Cephalexin, and Codeine  Family History  Problem Relation Age of Onset  . Heart disease Mother   . Hypertension Mother   . Heart disease Father   . Asthma Sister   . Emphysema Sister        COPD  . Hypertension Sister   . Heart disease Maternal Grandmother   . CAD Brother     Social History   Socioeconomic History  . Marital status: Divorced    Spouse name: Nermeen Wiles  . Number of children: 2  . Years of education: 30  . Highest education level: Not on file  Occupational History  . Occupation: Retired Mining engineer: RETIRED  Tobacco Use  . Smoking status: Never Smoker  . Smokeless tobacco: Never Used  Substance and Sexual Activity  . Alcohol use: No    Alcohol/week: 0.0 standard drinks  . Drug use: No  . Sexual activity: Not Currently    Partners: Male    Birth control/protection: Surgical    Comment: Hyst  Other Topics Concern  . Not on file  Social History Narrative  . Not on file   Social Determinants of Health   Financial Resource Strain:   . Difficulty of Paying Living Expenses: Not on file  Food Insecurity:   . Worried About Charity fundraiser in the Last Year: Not on file  . Ran Out of Food in the Last Year: Not on file  Transportation Needs:   . Lack of Transportation (Medical): Not on file  . Lack of Transportation (Non-Medical): Not on file  Physical Activity:   . Days of  Exercise per Week: Not on file  . Minutes of Exercise per Session: Not on file  Stress:   . Feeling of Stress : Not on file  Social Connections:   . Frequency of Communication with Friends and Family: Not on file  . Frequency of Social Gatherings with Friends and Family:  Not on file  . Attends Religious Services: Not on file  . Active Member of Clubs or Organizations: Not on file  . Attends Archivist Meetings: Not on file  . Marital Status: Not on file  Intimate Partner Violence:   . Fear of Current or Ex-Partner: Not on file  . Emotionally Abused: Not on file  . Physically Abused: Not on file  . Sexually Abused: Not on file    Review of Systems  Constitutional: Negative.   HENT: Negative.   Eyes: Negative.   Respiratory: Negative.   Cardiovascular: Negative.   Gastrointestinal: Negative.   Endocrine: Negative.   Genitourinary: Dysuria: incontinence.  Musculoskeletal: Negative.   Skin: Negative.   Allergic/Immunologic: Negative.   Neurological: Negative.   Hematological: Negative.   Psychiatric/Behavioral: Negative.     PHYSICAL EXAMINATION:    BP 138/64 (BP Location: Left Arm, Patient Position: Sitting, Cuff Size: Normal)   Pulse 68   Temp (!) 97 F (36.1 C) (Skin)   Resp 14   Ht 5\' 4"  (1.626 m)   Wt 188 lb (85.3 kg)   LMP 10/01/1994   BMI 32.27 kg/m     General appearance: alert, cooperative and appears stated age   Pelvic: External genitalia:  Fusion of the bilateral labia minora.   Whitish skin change of perineum.  No raised lesions.               Urethra:  normal appearing urethra with no masses, tenderness or lesions             Chaperone was present for exam.  ASSESSMENT  Osteopenia.  Increased fracture risk.  On Actonel.  Vulvitis.  Chronic.  Overactive bladder.   PLAN  Take Actonel on an empty stomach and with full glass of water in AM. Take Ca/Vit D bid.  Continue exercise.  Refill of Actonel.  Next BMD in 2 years.  We reviewed  Valisone ointment use bid for 2 weeks at a time for a flare and then maintenance dosing of twice weekly.  Bladder irritants reviewed.  I did discuss potential physical therapy for her bladder. She will see her urogynecologist.  Written information on overactive bladder. FU for annual exam and prn.    An After Visit Summary was printed and given to the patient.  __25____ minutes face to face time of which over 50% was spent in counseling.

## 2020-04-01 ENCOUNTER — Emergency Department (HOSPITAL_COMMUNITY): Payer: Medicare HMO

## 2020-04-01 ENCOUNTER — Emergency Department (HOSPITAL_COMMUNITY)
Admission: EM | Admit: 2020-04-01 | Discharge: 2020-04-01 | Disposition: A | Discharge status: Home / Self Care | Payer: Medicare HMO | Attending: Emergency Medicine | Admitting: Emergency Medicine

## 2020-04-01 ENCOUNTER — Other Ambulatory Visit: Payer: Self-pay

## 2020-04-01 DIAGNOSIS — J45909 Unspecified asthma, uncomplicated: Secondary | ICD-10-CM | POA: Diagnosis not present

## 2020-04-01 DIAGNOSIS — I10 Essential (primary) hypertension: Secondary | ICD-10-CM | POA: Insufficient documentation

## 2020-04-01 DIAGNOSIS — Z96651 Presence of right artificial knee joint: Secondary | ICD-10-CM | POA: Diagnosis not present

## 2020-04-01 DIAGNOSIS — Y92512 Supermarket, store or market as the place of occurrence of the external cause: Secondary | ICD-10-CM | POA: Insufficient documentation

## 2020-04-01 DIAGNOSIS — Y999 Unspecified external cause status: Secondary | ICD-10-CM | POA: Insufficient documentation

## 2020-04-01 DIAGNOSIS — Z96612 Presence of left artificial shoulder joint: Secondary | ICD-10-CM | POA: Insufficient documentation

## 2020-04-01 DIAGNOSIS — Z79899 Other long term (current) drug therapy: Secondary | ICD-10-CM | POA: Insufficient documentation

## 2020-04-01 DIAGNOSIS — Y939 Activity, unspecified: Secondary | ICD-10-CM | POA: Insufficient documentation

## 2020-04-01 DIAGNOSIS — W010XXA Fall on same level from slipping, tripping and stumbling without subsequent striking against object, initial encounter: Secondary | ICD-10-CM | POA: Insufficient documentation

## 2020-04-01 DIAGNOSIS — S0990XA Unspecified injury of head, initial encounter: Secondary | ICD-10-CM | POA: Diagnosis present

## 2020-04-01 MED ORDER — ACETAMINOPHEN 500 MG PO TABS
1000.0000 mg | ORAL_TABLET | Freq: Once | ORAL | Status: AC
  Administered 2020-04-01: 1000 mg via ORAL
  Filled 2020-04-01: qty 2

## 2020-04-01 MED ORDER — METOCLOPRAMIDE HCL 5 MG/ML IJ SOLN
10.0000 mg | Freq: Once | INTRAMUSCULAR | Status: AC
  Administered 2020-04-01: 10 mg via INTRAVENOUS
  Filled 2020-04-01: qty 2

## 2020-04-01 MED ORDER — DIPHENHYDRAMINE HCL 50 MG/ML IJ SOLN
12.5000 mg | Freq: Once | INTRAMUSCULAR | Status: AC
  Administered 2020-04-01: 12.5 mg via INTRAVENOUS
  Filled 2020-04-01: qty 1

## 2020-04-01 NOTE — Discharge Instructions (Signed)
Please read instructions below. You can treat your headache with over-the-counter medications such as tylenol as needed. Stay hydrated and get plenty of rest. Limit your screen time and complex thinking. Avoid any contact sports/activities to prevent re-injury to your head. Follow up with your primary care provider in 1 week for re-check and to be cleared to return to normal activity. Return to the ER if you develop severely worsening headache, changes in your vision, persistent vomiting, or new or concerning symptoms.

## 2020-04-01 NOTE — ED Notes (Signed)
ED Provider at bedside. 

## 2020-04-01 NOTE — ED Provider Notes (Signed)
[place] DEPT Provider Note   CSN: [id number] Arrival date & time: 04/01/20  1303     History Chief Complaint  Patient presents with  . Head Injury    Dawn Salinas is a 74 y.o. female w PMHx HTN, fibromyalgia, HLD< presenting to the ED with complaint of mechanical fall that occurred PTA. She tripped and fell backwards, hitting her occipital head. No LOC. She had sudden onset of HA and dizziness. HA was initially 7/10, improved since then, located top of her head. Denies neck or back pain, or other injuries. Not on anticoagulation. Denies numbness/tingling, weakness, n/v, vision changes.  Not on anticoagulation.  The history is provided by the patient.       Past Medical History:  Diagnosis Date  . Allergic rhinitis   . Asthma   . Depression   . Diverticulosis of colon (without mention of hemorrhage) 03/2008   Colonoscopy   . GERD (gastroesophageal reflux disease) 10/31/2012   Earlier Dx. with Asthma, but it was due to GERD by Dr. Asencion Noble  . Hip pain, acute 11/2012   Bursitis, got cortisone injection at Dr. Gladstone Lighter  . Hyperlipidemia   . Hypertension     Patient Active Problem List   Diagnosis Date Noted  . 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 Urinary, incontinence, stress female 12/18/2012  . Urge and stress incontinence 11/10/2012  . Situational anxiety 11/10/2012  . 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 ABNORMAL ELECTROCARDIOGRAM 09/14/2010  . FIBROMYALGIA 09/13/2010  . ABDOMINAL PAIN, GENERALIZED 08/22/2010  . HYPERCHOLESTEROLEMIA 08/17/2010  . DEPRESSION 08/17/2010  . HYPERTENSION 08/17/2010  . UMB HERNIA WITHOUT MENTION OBSTRUCTION/GANGRENE 08/17/2010  . DIVERTICULOSIS, COLON 08/17/2010  . FATIGUE 08/17/2010  . NAUSEA AND VOMITING 08/17/2010  . ABDOMINAL PAIN 08/17/2010  . PRE-DIABETES 08/17/2010    Past Surgical History:  Procedure Laterality Date  . APPENDECTOMY     . HYSTEROSCOPY  1996   TAH secondary to Endometriosisand ovarian cyctectomy  . NECK SURGERY  2005   fusion C 4-5  . TOTAL KNEE ARTHROPLASTY Right 1996  . TOTAL SHOULDER ARTHROPLASTY Left 10/21/2015   Procedure: TOTAL SHOULDER ARTHROPLASTY;  Surgeon: Netta Cedars, MD;  Location: [facility];  Service: Orthopedics;  Laterality: Left;     OB History    Gravida  2   Para  2   Term  2   Preterm  0   AB  0   Living  2     SAB  0   TAB  0   Ectopic  0   Multiple  0   Live Births  0           Family History  Problem Relation Age of Onset  . Heart disease Mother   . Hypertension Mother   . Heart disease Father   . Asthma Sister   . Emphysema Sister        COPD  . Hypertension Sister   . Heart disease Maternal Grandmother   . CAD Brother     Social History   Tobacco Use  . Smoking status: Never Smoker  . Smokeless tobacco: Never Used  Vaping Use  . Vaping Use: Never used  Substance Use Topics  . Alcohol use: No    Alcohol/week: 0.0 standard drinks  . Drug use: No    Home Medications Prior to Admission medications   Medication Sig Start Date End Date Taking? Authorizing Provider  albuterol (PROVENTIL HFA;VENTOLIN  HFA) 108 (90 Base) MCG/ACT inhaler Inhale 2 puffs into the lungs every 4 (four) hours as needed for wheezing or shortness of breath.  05/31/16  Yes [provider]  ALPRAZolam Duanne Moron) 0.5 MG tablet Take 0.25 mg by mouth daily as needed for anxiety or sleep.  03/18/17  Yes [provider]  Alum Hydroxide-Mag Carbonate 160-105 MG CHEW Chew 1 tablet by mouth daily.    Yes [provider]  amLODipine (NORVASC) 5 MG tablet Take 1 tablet (5 mg total) by mouth daily. 11/23/19  Yes Lelon Perla, MD  calcium-vitamin D (OSCAL WITH D) 250-125 MG-UNIT tablet Take 1 tablet by mouth daily.    Yes [provider]  candesartan (ATACAND) 32 MG tablet Take 32 mg by mouth daily.  05/09/18  Yes [provider]  diphenhydrAMINE  HCl (BENADRYL ALLERGY PO) Take 1 tablet by mouth daily as needed (allergy).    Yes [provider]  escitalopram (LEXAPRO) 20 MG tablet Take 20 mg by mouth daily.    Yes [provider]  famotidine (PEPCID) 20 MG tablet Take 20 mg by mouth daily.  02/16/20  Yes [provider]  oxybutynin (DITROPAN-XL) 10 MG 24 hr tablet Take 10 mg by mouth daily. 05/16/18  Yes [provider]  risedronate (ACTONEL) 35 MG tablet Take 1 tablet (35 mg total) by mouth every 7 (seven) days. with water on empty stomach, nothing by mouth or lie down for next 30 minutes. Patient taking differently: Take 35 mg by mouth every 7 (seven) days. Take with water on empty stomach, nothing by mouth or lie down for next 30 minutes. 11/23/19  Yes Nunzio Cobbs, MD  traMADol (ULTRAM) 50 MG tablet Take 1 tablet by mouth daily as needed for moderate pain.    Yes [provider]  Vitamin D, Ergocalciferol, (DRISDOL) 1.25 MG (50000 UNIT) CAPS capsule Take 50,000 Units by mouth every 7 (seven) days.  02/18/20  Yes [provider]  betamethasone valerate ointment (VALISONE) 0.1 % Apply 1 application topically 2 (two) times daily. Use for 2 weeks at a time as needed.  You may use this ointment twice a week for a maintenance dose. Patient not taking: Reported on 04/01/2020 11/23/19   Nunzio Cobbs, MD    Allergies    Mirabegron, Cephalexin, and Codeine  Review of Systems   Review of Systems  Eyes: Positive for photophobia.  Neurological: Positive for headaches.  All other systems reviewed and are negative.   Physical Exam Updated Vital Signs BP (!) 129/59 (BP Location: Left Arm)   Pulse 86   Temp 98.5 F (36.9 C) (Oral)   Resp 19   LMP 10/01/1994   SpO2 96%   Physical Exam Vitals and nursing note reviewed.  Constitutional:      General: She is not in acute distress.    Appearance: She is well-developed.  HENT:     Head: Normocephalic.     Comments:  Tender hematoma to the left upper occipital scalp.  No deformity noted.  No lacerations. Eyes:     Conjunctiva/sclera: Conjunctivae normal.  Cardiovascular:     Rate and Rhythm: Normal rate and regular rhythm.  Pulmonary:     Effort: Pulmonary effort is normal. No respiratory distress.     Breath sounds: Normal breath sounds.  Abdominal:     Palpations: Abdomen is soft.  Musculoskeletal:     Cervical back: Normal range of motion and neck supple. No tenderness.  Comments: No midline spinal/paraspinal TTP. No bony step offs. Normal ROM neck. Extremities appear atraumatic.  Skin:    General: Skin is warm.  Neurological:     Mental Status: She is alert.     Comments: Mental Status:  Alert, oriented, thought content appropriate, able to give a coherent history. Speech fluent without evidence of aphasia. Able to follow 2 step commands without difficulty.  CN intact. Motor:  Normal tone. 5/5 strength in upper and lower extremities bilaterally including strong and equal grip strength and dorsiflexion/plantar flexion Sensory: grossly normal in all extremities.  Cerebellar: normal finger-to-nose with bilateral upper extremities Gait: normal gait and balance CV: distal pulses palpable throughout    Psychiatric:        Behavior: Behavior normal.     ED Results / Procedures / Treatments   Labs (all labs ordered are listed, but only abnormal results are displayed) Labs Reviewed - No data to display  EKG None  Radiology CT Head Wo Contrast  Result Date: 04/01/2020 CLINICAL DATA:  Recent trip and fall with posterior head injury, initial encounter EXAM: CT HEAD WITHOUT CONTRAST TECHNIQUE: Contiguous axial images were obtained from the base of the skull through the vertex without intravenous contrast. COMPARISON:  None. FINDINGS: Brain: No evidence of acute infarction, hemorrhage, hydrocephalus, extra-axial collection or mass lesion/mass effect. Vascular: No hyperdense vessel or unexpected  calcification. Skull: Normal. Negative for fracture or focal lesion. Sinuses/Orbits: No acute finding. Other: Mild scalp hematoma is noted in the posterior vertex on the left. IMPRESSION: Mild scalp hematoma.  No acute intracranial abnormality is noted. Electronically Signed   By: Inez Catalina M.D.   On: 04/01/2020 14:54    Procedures Procedures (including critical care time)  Medications Ordered in ED Medications  acetaminophen (TYLENOL) tablet 1,000 mg (1,000 mg Oral Given 04/01/20 1402)  metoCLOPramide (REGLAN) injection 10 mg (10 mg Intravenous Given 04/01/20 1401)  diphenhydrAMINE (BENADRYL) injection 12.5 mg (12.5 mg Intravenous Given 04/01/20 1402)    ED Course  I have reviewed the triage vital signs and the nursing notes.  Pertinent labs & imaging results that were available during my care of the patient were reviewed by me and considered in my medical decision making (see chart for details).    MDM Rules/Calculators/A&P                          Pt presenting with headache after mechanical fall that occurred prior to arrival.  She hit her posterior head on the ground, no LOC.  No other injuries reported.  She has headache with slight photophobia.  No vomiting or vision changes.  No focal neuro deficits are appreciated on exam.  CT scan is negative for acute intracranial abnormality.  Headache treated in the ED with significant improvement.  Patient will be discharged with concussion precautions, symptomatic management discussed.  Encourage PCP follow-up.  Return precautions discussed.  Patient is well-appearing, agreeable plan, safe for discharge.  Discussed results, findings, treatment and follow up. Patient advised of return precautions. Patient verbalized understanding and agreed with plan.  Final Clinical Impression(s) / ED Diagnoses Final diagnoses:  Minor head injury, initial encounter    Rx / DC Orders ED Discharge Orders    None       Hadassa Cermak, [place] N, PA-C 04/01/20  1525    Daleen Bo, MD 04/02/20 [phone]

## 2020-04-01 NOTE — ED Notes (Signed)
Patient transported to CT at this time. 

## 2020-04-01 NOTE — ED Provider Notes (Signed)
  Face-to-face evaluation   History: She injured her head today when she fell backwards while walking in a store.  She is not sure why she fell.  She was able ambulate afterwards but came here by private vehicle for evaluation.  Physical exam: Alert, calm, cooperative.  She is amatory without difficulty.  Head mildly tender posterior without crepitation deformity.  Extremities normal range of motion arms legs bilaterally.  Contusion right volar forearm.   Medical screening examination/treatment/procedure(s) were conducted as a shared visit with non-physician practitioner(s) and myself.  I personally evaluated the patient during the encounter    Daleen Bo, MD 04/02/20 [phone]

## 2020-04-01 NOTE — ED Triage Notes (Signed)
[facility] transported pt from retail store and reports the following:  Pt tripped, fell, and hit back of her head. Hematoma at site of impact. Headache initially 8/10 and 4/10 upon arrival. No bleeding. Reports dizziness and headache. No loss of consciousness, numbness, tingling.

## 2020-04-01 NOTE — ED Notes (Signed)
Provider provided patient with ice pack.

## 2020-07-23 ENCOUNTER — Encounter: Payer: Self-pay | Admitting: Obstetrics and Gynecology

## 2020-08-15 ENCOUNTER — Encounter: Payer: Self-pay | Admitting: Dermatology

## 2020-08-15 ENCOUNTER — Ambulatory Visit: Payer: Medicare HMO | Admitting: Dermatology

## 2020-08-15 ENCOUNTER — Other Ambulatory Visit: Payer: Self-pay

## 2020-08-15 DIAGNOSIS — L821 Other seborrheic keratosis: Secondary | ICD-10-CM

## 2020-08-15 DIAGNOSIS — L720 Epidermal cyst: Secondary | ICD-10-CM

## 2020-08-15 DIAGNOSIS — D1801 Hemangioma of skin and subcutaneous tissue: Secondary | ICD-10-CM | POA: Diagnosis not present

## 2020-08-15 DIAGNOSIS — Z1283 Encounter for screening for malignant neoplasm of skin: Secondary | ICD-10-CM | POA: Diagnosis not present

## 2020-08-19 ENCOUNTER — Encounter: Payer: Self-pay | Admitting: Dermatology

## 2020-08-19 NOTE — Progress Notes (Signed)
   Follow-Up Visit   Subjective  Dawn Salinas is a 74 y.o. female who presents for the following: New Patient (Initial Visit) (Patient here today for skin check. Check spot on right temple, and left breast x 6 months no bleeding, no pain).  General skin check Location: Newer spots on right temple and left breast Duration:  Quality:  Associated Signs/Symptoms: Modifying Factors:  Severity:  Timing: Context:   Objective  Well appearing patient in no apparent distress; mood and affect are within normal limits.  A full examination was performed including scalp, head, eyes, ears, nose, lips, neck, chest, axillae, abdomen, back, buttocks, bilateral upper extremities, bilateral lower extremities, hands, feet, fingers, toes, fingernails, and toenails. All findings within normal limits unless otherwise noted below.   Assessment & Plan    Screening for malignant neoplasm of skin Mid Back  Yearly skin check.  Milia (2) Left Lateral Canthus; Right Lateral Canthus  Benign no treatment needed.  Hemangioma of skin Chest - Medial [facility])  Benign no treatment needed.   Seborrheic keratosis (5) Left Upper Arm - Anterior; Left Thigh - Anterior; Left Inframammary Fold; Right Inframammary Fold; Right Inguinal Area  Benign lesions no treatment needed.      I, Lavonna Monarch, MD, have reviewed all documentation for this visit.  The documentation on 08/19/20 for the exam, diagnosis, procedures, and orders are all accurate and complete.

## 2020-08-22 ENCOUNTER — Other Ambulatory Visit: Payer: Self-pay | Admitting: Cardiology

## 2020-08-22 DIAGNOSIS — R072 Precordial pain: Secondary | ICD-10-CM

## 2020-11-06 NOTE — Progress Notes (Deleted)
{Choose 1 Note Type (Telehealth Visit or Telephone Visit):[phone]} Virtual platform was offered given ongoing worsening Covid-19 pandemic.  Date:  11/06/2020   ID:  Dawn Salinas, DOB 10-30-1945, MRN [id number]  Patient Location: Home Provider Location: [facility]  PCP:  Chesley Noon, MD  Cardiologist:  Kirk Ruths, MD  Electrophysiologist:  None   Evaluation Performed:  Follow-Up Visit  Chief Complaint: follow-up of chest pain  History of Present Illness:    Dawn Salinas is a 75 y.o. female with a history of chest pain with normal cardiac catheterization in 1999 and 2000 and normal stress test in 04/2019, hypertension, hyperlipidemia intolerant to statins, GERD, and depression who is followed by Dr. Stanford Breed and presents today for routine follow-up.  Patient referred to Dr. Stanford Breed in 04/2019 for evaluation of chest pain after not having been seen by Cardiology since 2011. She had 2 prior cardiac catheterization in 1999 and 2000 but of which were reportedly normal but no records available from these. At that visit with Dr. Stanford Breed, she reported chest heaviness a few months prior as well as dypsnea on exertion. Lexiscan Myoview was ordered for further evaluation and was low risk with no evidence of ischemia. Patient was last seen by Dr. Stanford Breed in 10/2019 at which time she continued to note occasional chest tightness following activities which was stable and unchanged but denied any exertional chest pain.  Patient presents today for routine follow-up. ***  Chest Pain - History of non-exertional chest pain. *** Patient reportedly had cardiac catheterization in 1999 and 2000 which were normal but no records available. Lexiscan Myoview in 04/2019 was low risk with no evidence of ischemia.  - ***  Hypertension - *** - Continue current medications: Amlodipine 5mg  daily and Candesartan 32mg  daily. ***  Hyperlipidemia - Intolerant to statin. - Managed by PCP.    Past  Medical History:  Diagnosis Date   Allergic rhinitis    Asthma    Depression    Diverticulosis of colon (without mention of hemorrhage) 03/2008   Colonoscopy    GERD (gastroesophageal reflux disease) 10/31/2012   Earlier Dx. with Asthma, but it was due to GERD by Dr. Asencion Noble   Hip pain, acute 11/2012   Bursitis, got cortisone injection at Dr. Gladstone Lighter   Hyperlipidemia    Hypertension    Past Surgical History:  Procedure Laterality Date   [place]il secondary to Endometriosisand ovarian cyctectomy   NECK SURGERY  2005   fusion C 4-5   TOTAL KNEE ARTHROPLASTY Right 1996   TOTAL SHOULDER ARTHROPLASTY Left 10/21/2015   Procedure: TOTAL SHOULDER ARTHROPLASTY;  Surgeon: Netta Cedars, MD;  Location: [facility];  Service: Orthopedics;  Laterality: Left;     No outpatient medications have been marked as taking for the 11/09/20 encounter (Appointment) with Darreld Mclean, PA-C.     Allergies:   Mirabegron, Cephalexin, and Codeine   Social History   Tobacco Use   Smoking status: Never Smoker   Smokeless tobacco: Never Used  Vaping Use   Vaping Use: Never used  Substance Use Topics   Alcohol use: No    Alcohol/week: 0.0 standard drinks   Drug use: No     Family Hx: The patient's family history includes Asthma in her sister; CAD in her brother; Emphysema in her sister; Heart disease in her father, maternal grandmother, and mother; Hypertension in her mother and sister.  ROS:   Please see the history  of present illness.    All other systems reviewed and are negative.   Prior CV studies:    The following studies were reviewed today:   Lexiscan Myoview 04/23/2019:  Nuclear stress EF: 70%.  The left ventricular ejection fraction is hyperdynamic (>65%).  No T wave inversion was noted during stress.  There was no ST segment deviation noted during stress.  This is a low risk study.   Normal perfusion. LVEF 70% with normal  wall motion. This is a low risk study.   Labs/Other Tests and Data Reviewed:    EKG: Most recent EKG from 04/06/2019 personally reviewed and demonstrates: Normal sinus rhythm, rate 87 bpm, with no acute ischemic changes. Normal axis. Normal PR and QRS intervals. QTc 435 ms  Recent Labs: No results found for requested labs within last 8760 hours.   Recent Lipid Panel No results found for: CHOL, TRIG, HDL, CHOLHDL, LDLCALC, LDLDIRECT  Wt Readings from Last 3 Encounters:  11/23/19 188 lb (85.3 kg)  10/30/19 190 lb (86.2 kg)  09/01/19 192 lb (87.1 kg)     Objective:    Vital Signs:  LMP 10/01/1994    VS Reviewed. General: No acute distress. Pulm: No labored breathing. No coughing during visit. No audible wheezing. Speaking in full sentences. Neuro: Alert and oriented. No slurred speech. Answers questions appropriately. Psych: Pleasant affect.  ASSESSMENT & PLAN:    1. ***  COVID-19 Education: The signs and symptoms of COVID-19 were discussed with the patient and how to seek care for testing (follow up with PCP or arrange E-visit).  The importance of social distancing was discussed today.  Time:   Today, I have spent *** minutes with the patient with telehealth technology discussing the above problems.     Medication Adjustments/Labs and Tests Ordered: Current medicines are reviewed at length with the patient today.  Concerns regarding medicines are outlined above.   Follow Up:  {F/U Format:[phone]} {follow up:15908}  Signed, Eppie Gibson  11/06/2020 3:42 PM    [facility]

## 2020-11-09 ENCOUNTER — Telehealth: Payer: Self-pay

## 2020-11-09 ENCOUNTER — Telehealth: Payer: Medicare HMO | Admitting: Student

## 2020-11-16 ENCOUNTER — Other Ambulatory Visit: Payer: Self-pay | Admitting: Cardiology

## 2020-11-16 DIAGNOSIS — R072 Precordial pain: Secondary | ICD-10-CM

## 2021-03-10 ENCOUNTER — Other Ambulatory Visit: Payer: Self-pay | Admitting: Cardiology

## 2021-03-10 DIAGNOSIS — R072 Precordial pain: Secondary | ICD-10-CM

## 2021-07-27 ENCOUNTER — Encounter: Payer: Self-pay | Admitting: Obstetrics and Gynecology

## 2021-08-08 ENCOUNTER — Encounter: Payer: Self-pay | Admitting: Obstetrics and Gynecology

## 2021-08-16 ENCOUNTER — Ambulatory Visit: Payer: Medicare HMO | Admitting: Dermatology

## 2021-10-18 ENCOUNTER — Other Ambulatory Visit: Payer: Self-pay | Admitting: Cardiology

## 2021-10-18 DIAGNOSIS — R072 Precordial pain: Secondary | ICD-10-CM

## 2021-11-20 ENCOUNTER — Other Ambulatory Visit: Payer: Self-pay

## 2021-11-20 ENCOUNTER — Ambulatory Visit: Payer: Medicare HMO | Admitting: Dermatology

## 2021-11-20 DIAGNOSIS — L821 Other seborrheic keratosis: Secondary | ICD-10-CM

## 2021-11-20 DIAGNOSIS — Z1283 Encounter for screening for malignant neoplasm of skin: Secondary | ICD-10-CM

## 2021-11-20 DIAGNOSIS — L729 Follicular cyst of the skin and subcutaneous tissue, unspecified: Secondary | ICD-10-CM

## 2021-11-20 NOTE — Patient Instructions (Signed)
Use a solid stick on body folds (antiperspirant)

## 2021-11-22 ENCOUNTER — Encounter: Payer: Self-pay | Admitting: Dermatology

## 2021-11-22 NOTE — Progress Notes (Signed)
° °  Follow-Up Visit   Subjective  Dawn Salinas is a 76 y.o. female who presents for the following: Annual Exam (Pt here for annual. Pt has a spot of concern on the lower abdomen (rash that smells), L Upper arm, side breast.).  General skin examination, check area under breasts Location:  Duration:  Quality:  Associated Signs/Symptoms: Modifying Factors:  Severity:  Timing: Context:   Objective  Well appearing patient in no apparent distress; mood and affect are within normal limits. Scalp General skin examination: No atypical pigmented lesions or nonmelanoma skin cancer  Left Inframammary Fold, Left Thigh - Anterior, Mid Back (6), Right Inframammary Fold, Right Upper Arm - Posterior Several dozen flattopped tan textured papules with typical dermoscopy including many in the inframammary fold.  I do not appreciate any malodor.  Right Upper Back Noninflamed 1 cm dermal papule    A full examination was performed including scalp, head, eyes, ears, nose, lips, neck, chest, axillae, abdomen, back, buttocks, bilateral upper extremities, bilateral lower extremities, hands, feet, fingers, toes, fingernails, and toenails. All findings within normal limits unless otherwise noted below.  Areas beneath undergarments not fully examined.   Assessment & Plan    Encounter for screening for malignant neoplasm of skin Scalp  Annual skin examination  Seborrheic keratosis (10) Right Upper Arm - Posterior; Left Thigh - Anterior; Left Inframammary Fold; Right Inframammary Fold; Mid Back (6)  Patient has been using an over-the-counter powder with some benefit.  Should this fail, she she may try a solid stick antiperspirant every 2 to 3 days.  Cyst of skin Right Upper Back  Recheck.  Change      I, Lavonna Monarch, MD, have reviewed all documentation for this visit.  The documentation on 11/22/21 for the exam, diagnosis, procedures, and orders are all accurate and complete.

## 2022-02-19 ENCOUNTER — Other Ambulatory Visit: Payer: Self-pay | Admitting: Cardiology

## 2022-02-19 DIAGNOSIS — R072 Precordial pain: Secondary | ICD-10-CM

## 2022-02-26 ENCOUNTER — Other Ambulatory Visit: Payer: Self-pay

## 2022-02-26 ENCOUNTER — Emergency Department (HOSPITAL_BASED_OUTPATIENT_CLINIC_OR_DEPARTMENT_OTHER)
Admission: EM | Admit: 2022-02-26 | Discharge: 2022-02-26 | Disposition: A | Discharge status: Home / Self Care | Payer: Medicare HMO | Attending: Emergency Medicine | Admitting: Emergency Medicine

## 2022-02-26 ENCOUNTER — Encounter (HOSPITAL_BASED_OUTPATIENT_CLINIC_OR_DEPARTMENT_OTHER): Payer: Self-pay | Admitting: Emergency Medicine

## 2022-02-26 DIAGNOSIS — I10 Essential (primary) hypertension: Secondary | ICD-10-CM | POA: Insufficient documentation

## 2022-02-26 DIAGNOSIS — J45909 Unspecified asthma, uncomplicated: Secondary | ICD-10-CM | POA: Insufficient documentation

## 2022-02-26 DIAGNOSIS — H1132 Conjunctival hemorrhage, left eye: Secondary | ICD-10-CM | POA: Insufficient documentation

## 2022-02-26 DIAGNOSIS — H5789 Other specified disorders of eye and adnexa: Secondary | ICD-10-CM | POA: Diagnosis present

## 2022-02-26 DIAGNOSIS — Z79899 Other long term (current) drug therapy: Secondary | ICD-10-CM | POA: Insufficient documentation

## 2022-02-26 NOTE — ED Provider Notes (Signed)
[facility] EMERGENC[facility] DEPT Provider Note   CSN: [id number] Arrival date & time: 02/26/22  1552     History  Chief Complaint  Patient presents with   Eye Problem    Dawn Salinas is a 76 y.o. female. With past medical history of hypertension, HLD, asthma who presents to the emergency department with eye redness.   States that this afternoon she was putting on mascara when she noted significant redness in her left eye that looked like bleeding. Since, she has noted the sensation of dry eye. She denies changes to her vision, eye drainage, itching, trauma to the eye, photophobia, being around wind/dust/debris that could have penetrated the eye. She denies pain. Denies swelling to the eye. Denies pain with EOM. Does not wear contacts or glasses.    Eye Problem Associated symptoms: redness       Home Medications Prior to Admission medications   Medication Sig Start Date End Date Taking? Authorizing Provider  albuterol (PROVENTIL HFA;VENTOLIN HFA) 108 (90 Base) MCG/ACT inhaler Inhale 2 puffs into the lungs every 4 (four) hours as needed for wheezing or shortness of breath.  05/31/16   [provider]  ALPRAZolam Duanne Moron) 0.5 MG tablet Take 0.25 mg by mouth daily as needed for anxiety or sleep.  03/18/17   [provider]  Alum Hydroxide-Mag Carbonate 160-105 MG CHEW Chew 1 tablet by mouth daily.     [provider]  amLODipine (NORVASC) 5 MG tablet TAKE 1 TABLET BY MOUTH EVERY DAY (FOR MORE REFILLS MAKE OFFICE VISIT) 02/20/22   Lelon Perla, MD  amoxicillin (AMOXIL) 500 MG capsule SMARTSIG:4 Capsule(s) By Mouth 11/14/21   [provider]  betamethasone valerate ointment (VALISONE) 0.1 % Apply 1 application topically 2 (two) times daily. Use for 2 weeks at a time as needed.  You may use this ointment twice a week for a maintenance dose. 11/23/19   Nunzio Cobbs, MD  calcium-vitamin D (OSCAL WITH D) 250-125 MG-UNIT tablet Take 1  tablet by mouth daily.     [provider]  candesartan (ATACAND) 32 MG tablet Take 32 mg by mouth daily.  05/09/18   [provider]  diphenhydrAMINE HCl (BENADRYL ALLERGY PO) Take 1 tablet by mouth daily as needed (allergy).     [provider]  escitalopram (LEXAPRO) 20 MG tablet Take 20 mg by mouth daily.     [provider]  estradiol (ESTRACE) 0.1 MG/GM vaginal cream estradiol 0.01% (0.1 mg/gram) vaginal cream 08/03/21   [provider]  famotidine (PEPCID) 20 MG tablet Take 20 mg by mouth daily.  02/16/20   [provider]  gabapentin (NEURONTIN) 100 MG capsule Take 100 mg by mouth 3 (three) times daily. 04/16/20   [provider]  oxybutynin (DITROPAN-XL) 10 MG 24 hr tablet Take 10 mg by mouth daily. 05/16/18   [provider]  risedronate (ACTONEL) 35 MG tablet Take 1 tablet (35 mg total) by mouth every 7 (seven) days. with water on empty stomach, nothing by mouth or lie down for next 30 minutes. Patient taking differently: Take 35 mg by mouth every 7 (seven) days. Take with water on empty stomach, nothing by mouth or lie down for next 30 minutes. 11/23/19   Nunzio Cobbs, MD  traMADol (ULTRAM) 50 MG tablet Take 1 tablet by mouth daily as needed for moderate pain.     [provider]  Vitamin D, Ergocalciferol, (DRISDOL) 1.25 MG (50000 UNIT) CAPS  capsule Take 50,000 Units by mouth every 7 (seven) days.  02/18/20   [provider]      Allergies    Mirabegron, Cephalexin, and Codeine    Review of Systems   Review of Systems  Eyes:  Positive for redness.  All other systems reviewed and are negative.  Physical Exam Updated Vital Signs BP (!) 151/63 (BP Location: Right Arm)   Pulse 84   Temp 98.2 F (36.8 C) (Oral)   Resp 18   Ht '5\' 4"'$  (1.626 m)   Wt 85 kg   LMP 10/01/1994   SpO2 98%   BMI 32.17 kg/m  Physical Exam Vitals and nursing note reviewed.  HENT:     Head: Normocephalic  and atraumatic.  Eyes:     General: Vision grossly intact. No scleral icterus.       Right eye: No foreign body.        Left eye: No foreign body.     Extraocular Movements: Extraocular movements intact.     Pupils: Pupils are equal, round, and reactive to light.     Comments: See photo   Pulmonary:     Effort: Pulmonary effort is normal. No respiratory distress.  Skin:    Findings: No rash.  Neurological:     General: No focal deficit present.     Mental Status: She is alert.  Psychiatric:        Mood and Affect: Mood normal.        Behavior: Behavior normal.        Thought Content: Thought content normal.        Judgment: Judgment normal.     ED Results / Procedures / Treatments   Labs (all labs ordered are listed, but only abnormal results are displayed) Labs Reviewed - No data to display  EKG None  Radiology No results found.  Procedures Procedures   Medications Ordered in ED Medications - No data to display  ED Course/ Medical Decision Making/ A&P                           Medical Decision Making This patient presents to the ED for concern of eye redness, this involves an extensive number of treatment options, and is a complaint that carries with it a high risk of complications and morbidity.  The differential diagnosis includes subconjunctival hemorrhage, hyphema, conjunctivitis, angle closure glaucoma, FB, globe rupture, corneal abrasion or ulcer, HSV keratitis, uveitis, etc.   Co morbidities that complicate the patient evaluation Hypertension   Additional history obtained:  Additional history obtained from: none  External records from outside source obtained and reviewed including: none   Medications  -I reviewed the patient's home medications and did not make adjustments. -I did not prescribe new home medications.  Tests Considered: Fluorescein uptake exam, tonometer exam - no indication at this time   Critical Interventions: None required    Consultations: I requested consultation with the ophthalmologist, Dr. Eulas Post,  and discussed lab and imaging findings as well as pertinent plan - they recommend: consistent with subconjunctival hemorrhage, can use artifical tears, can f/u in office if patient desires   SDH None identified   ED Course: 76 year old female who presents to the ED with eye redness.   Presents with scleral injection consistent with subconjunctival hemorrhage. Given history and exam I have low suspicion for corneal abrasion or ulcer, globe rupture, uveitis, HSV keratitis, endopthalmitis, retinal detachment, angle closure glaucoma,  FB, hypema. Do not feel that more formal eye exam is necessary at this time.  Consulted with ophthalmology, given the, what appears to be, continued bleeding vessel evident on picture above in PE section of note. Spoke with Dr. Eulas Post. States this will resolve. Patient can use artifical tears for any eye dryness or symptoms. No contacts. Otherwise she can follow-up in his clinic if she feels this is necessary in a few days.   Discussed with patient and she verbalizes understanding. Will use artifical tears. Will return for signs of hypema, vision changes, fever, pain.   After consideration of the diagnostic results and the patients response to treatment, I feel that the patent would benefit from discharge. The patient has been appropriately medically screened and/or stabilized in the ED. I have low suspicion for any other emergent medical condition which would require further screening, evaluation or treatment in the ED or require inpatient management. The patient is overall well appearing and non-toxic in appearance. They are hemodynamically stable at time of discharge.   Final Clinical Impression(s) / ED Diagnoses Final diagnoses:  Subconjunctival hemorrhage of left eye    Rx / DC Orders ED Discharge Orders     None         Mickie Hillier, PA-C 55/73/22 0254    Campbell Stall  P, DO 27/06/23 2324

## 2022-02-26 NOTE — ED Triage Notes (Signed)
Pt was putting make up on today and noticed left eye red and blood shot. Pt denies any injury and denies pain. No blurred vision.

## 2022-02-26 NOTE — Discharge Instructions (Signed)
You were seen in the emergency department today for bleeding in your eye.  This will resolve on its own.  You can use artificial tears which may be helpful for the drying sensation.  If you are still concerned about your eye or it continues to bother you I have provided you with information to Dr. Eulas Post, the eye doctor that looked at the pictures of your eye here in the emergency department.  Please return if you have blood within the colored part of your eye or begin to have fevers with eye pain and swelling.

## 2022-05-28 ENCOUNTER — Other Ambulatory Visit (HOSPITAL_BASED_OUTPATIENT_CLINIC_OR_DEPARTMENT_OTHER): Payer: Self-pay

## 2022-05-28 MED ORDER — ALPRAZOLAM 0.5 MG PO TABS
0.5000 mg | ORAL_TABLET | Freq: Every day | ORAL | 0 refills | Status: DC
  Filled 2022-05-28: qty 30, 30d supply, fill #0

## 2022-05-28 MED ORDER — ESCITALOPRAM OXALATE 20 MG PO TABS
40.0000 mg | ORAL_TABLET | Freq: Every day | ORAL | 0 refills | Status: DC
  Filled 2022-05-28: qty 180, 90d supply, fill #0

## 2022-05-29 ENCOUNTER — Other Ambulatory Visit (HOSPITAL_BASED_OUTPATIENT_CLINIC_OR_DEPARTMENT_OTHER): Payer: Self-pay

## 2022-05-29 MED ORDER — AMOXICILLIN 500 MG PO CAPS
500.0000 mg | ORAL_CAPSULE | ORAL | 12 refills | Status: AC
  Filled 2022-05-29: qty 8, 2d supply, fill #0

## 2022-07-23 ENCOUNTER — Other Ambulatory Visit (HOSPITAL_BASED_OUTPATIENT_CLINIC_OR_DEPARTMENT_OTHER): Payer: Self-pay

## 2022-08-20 ENCOUNTER — Other Ambulatory Visit (HOSPITAL_BASED_OUTPATIENT_CLINIC_OR_DEPARTMENT_OTHER): Payer: Self-pay

## 2022-08-20 MED ORDER — ALPRAZOLAM 0.5 MG PO TABS
0.5000 mg | ORAL_TABLET | Freq: Every day | ORAL | 0 refills | Status: DC | PRN
  Filled 2022-08-20: qty 30, 30d supply, fill #0

## 2022-08-20 MED ORDER — ESCITALOPRAM OXALATE 20 MG PO TABS
40.0000 mg | ORAL_TABLET | Freq: Every day | ORAL | 0 refills | Status: DC
  Filled 2022-08-20: qty 180, 90d supply, fill #0

## 2022-11-28 ENCOUNTER — Ambulatory Visit: Payer: Medicare HMO | Admitting: Dermatology

## 2023-03-18 ENCOUNTER — Encounter: Payer: Self-pay | Admitting: Nurse Practitioner

## 2023-03-26 ENCOUNTER — Other Ambulatory Visit (HOSPITAL_BASED_OUTPATIENT_CLINIC_OR_DEPARTMENT_OTHER): Payer: Self-pay

## 2023-03-26 MED ORDER — ESCITALOPRAM OXALATE 20 MG PO TABS
40.0000 mg | ORAL_TABLET | Freq: Every day | ORAL | 0 refills | Status: DC
  Filled 2023-03-26: qty 180, 90d supply, fill #0

## 2023-03-26 MED ORDER — ALPRAZOLAM 0.5 MG PO TABS
0.5000 mg | ORAL_TABLET | Freq: Every day | ORAL | 0 refills | Status: DC | PRN
  Filled 2023-03-26: qty 30, 30d supply, fill #0

## 2023-05-08 ENCOUNTER — Other Ambulatory Visit (HOSPITAL_BASED_OUTPATIENT_CLINIC_OR_DEPARTMENT_OTHER): Payer: Self-pay

## 2023-05-08 ENCOUNTER — Other Ambulatory Visit: Payer: Self-pay

## 2023-05-08 MED ORDER — ALPRAZOLAM 0.5 MG PO TABS
0.5000 mg | ORAL_TABLET | Freq: Every day | ORAL | 0 refills | Status: AC
  Filled 2023-05-08: qty 30, 30d supply, fill #0

## 2023-05-08 MED ORDER — ESCITALOPRAM OXALATE 20 MG PO TABS
40.0000 mg | ORAL_TABLET | Freq: Every day | ORAL | 0 refills | Status: AC
  Filled 2023-05-08 – 2023-07-11 (×2): qty 180, 90d supply, fill #0

## 2023-05-21 ENCOUNTER — Other Ambulatory Visit (HOSPITAL_BASED_OUTPATIENT_CLINIC_OR_DEPARTMENT_OTHER): Payer: Self-pay

## 2023-05-22 ENCOUNTER — Ambulatory Visit: Payer: Medicare HMO | Admitting: Nurse Practitioner

## 2023-05-22 ENCOUNTER — Encounter: Payer: Self-pay | Admitting: Nurse Practitioner

## 2023-05-22 VITALS — BP 128/60 | HR 94 | Ht 64.0 in | Wt 196.6 lb

## 2023-05-22 DIAGNOSIS — Z1211 Encounter for screening for malignant neoplasm of colon: Secondary | ICD-10-CM | POA: Diagnosis not present

## 2023-05-22 DIAGNOSIS — R1111 Vomiting without nausea: Secondary | ICD-10-CM

## 2023-05-22 DIAGNOSIS — K219 Gastro-esophageal reflux disease without esophagitis: Secondary | ICD-10-CM | POA: Diagnosis not present

## 2023-05-22 MED ORDER — CLENPIQ 10-3.5-12 MG-GM -GM/160ML PO SOLN: 1.0000 | ORAL | 0 refills | Status: DC

## 2023-05-22 NOTE — Progress Notes (Signed)
05/22/2023 Dawn Salinas [id number] [birth year]   CHIEF COMPLAINT: Schedule a colonoscopy   HISTORY OF PRESENT ILLNESS: Dawn Salinas is a 77 year old female with a past medical history of asthma, depression, hyperlipidemia, hypertension, GERD and diverticulosis. She presents to our office today as referred by Dawn Civatte PA-C to schedule a screening colonoscopy. She also endorses having worsening acid reflux symptoms with vomiting x 6 months. She describes having projectile vomiting which started 6 months ago, occurs once every 3 weeks. She first notices her mouth starts to water and shortly after she has  projectile vomit.  Emesis consists of food she just ate or liquid. No coffee ground or hematemesis. She has intermittent heartburn. She sometimes feels like a small piece of food gets stuck in her throat results and difficulty speaking and she coughs a bit.  She waits a minute or 2 then these symptoms abate and she is able to resume eating her meal.  No significant abdominal pain.  She passes a watery to loose stool she does not take a fiber supplement.  No black stools.  She infrequently sees a small amount of bright red blood on the toilet tissue which she attributes to having hemorrhoids.  She underwent a colonoscopy 03/2008 which was normal.  She is taking ASA 325 mg once daily for the past 3 weeks due to having headaches.  Her blood pressure has recently been elevated and she has seen her PCP regarding this issue.  She is taking Amlodipine-Benzapril  10-20mg  daily.  Labs 03/20/2023: WBC 9.0.  Hemoglobin 13.  Hematocrit 39.3.  MCV 89.  Platelet 348.  Glucose 135.  BUN 20.  Creatinine 0.92.  Sodium 139.  Potassium 4.9.  Total bili 0.3.  Alk phos 67.  AST 17.  ALT 13.  Lipase 23.   PAST GI PROCEDURES:  Colonoscopy 03/22/2008:  Normal colonoscopy  EGD 07/18/1999: Normal EGD  Past Medical History:  Diagnosis Date   Allergic rhinitis    Asthma    Depression    Diverticulosis of  colon (without mention of hemorrhage) 03/2008   Colonoscopy    GERD (gastroesophageal reflux disease) 10/31/2012   Earlier Dx. with Asthma, but it was due to GERD by Dr. Shan Salinas   Hip pain, acute 11/2012   Bursitis, got cortisone injection at Dr. Darrelyn Salinas   Hyperlipidemia    Hypertension    Past Surgical History:  Procedure Laterality Date   APPENDECTOMY     HYSTEROSCOPY  10/01/1994   TAH secondary to Endometriosisand ovarian cyctectomy   Imstem Bilateral    Bladder Control   NECK SURGERY  10/02/2003   fusion C 4-5   TOTAL KNEE ARTHROPLASTY Right 10/01/1994   TOTAL SHOULDER ARTHROPLASTY Left 10/21/2015   Procedure: TOTAL SHOULDER ARTHROPLASTY;  Surgeon: Dawn Low, MD;  Location: [facility] OR;  Service: Orthopedics;  Laterality: Left;   Social History: She is widowed.  She has 1 son.  She is retired.  Nonsmoker. No alcohol use. No drug use.   Family History: family history includes Asthma in her sister; CAD in her brother; Emphysema in her sister; Heart disease in her father, maternal grandmother, and mother; Hypertension in her mother and sister.  Allergies  Allergen Reactions   Mirabegron Other (See Comments)    Isolated hip joint/tendon pain, decreased strength.   Cephalexin Itching and Rash   Codeine Itching and Rash    REACTION: rash, itching, hives      Outpatient Encounter Medications as of 05/22/2023  Medication Sig   albuterol (PROVENTIL HFA;VENTOLIN HFA) 108 (90 Base) MCG/ACT inhaler Inhale 2 puffs into the lungs every 4 (four) hours as needed for wheezing or shortness of breath.    ALPRAZolam (XANAX) 0.5 MG tablet Take 1 tablet (0.5 mg total) by mouth daily as needed for anxiety.   Alum Hydroxide-Mag Carbonate 160-105 MG CHEW Chew 1 tablet by mouth daily.    amLODipine (NORVASC) 5 MG tablet TAKE 1 TABLET BY MOUTH EVERY DAY (FOR MORE REFILLS MAKE OFFICE VISIT) (Patient taking differently: Take 10 mg by mouth daily. TAKE 1 TABLET BY MOUTH EVERY DAY (FOR MORE REFILLS  MAKE OFFICE VISIT))   amoxicillin (AMOXIL) 500 MG capsule Take 4 capsules by mouth 1 hour prior to appointment.   calcium-vitamin D (OSCAL WITH D) 250-125 MG-UNIT tablet Take 1 tablet by mouth daily.    candesartan (ATACAND) 32 MG tablet Take 32 mg by mouth daily.    diphenhydrAMINE HCl (BENADRYL ALLERGY PO) Take 1 tablet by mouth daily as needed (allergy).    escitalopram (LEXAPRO) 20 MG tablet Take 2 tablets (40 mg total) by mouth daily.   estradiol (ESTRACE) 0.1 MG/GM vaginal cream estradiol 0.01% (0.1 mg/gram) vaginal cream   famotidine (PEPCID) 20 MG tablet Take 20 mg by mouth daily.    levocetirizine (XYZAL) 5 MG tablet Take 5 mg by mouth every evening.   Sod Picosulfate-Mag Ox-Cit Acd (CLENPIQ) 10-3.5-12 MG-GM -GM/160ML SOLN Take 1 kit by mouth as directed.   traMADol (ULTRAM) 50 MG tablet Take 1 tablet by mouth daily as needed for moderate pain.    Vitamin D, Ergocalciferol, (DRISDOL) 1.25 MG (50000 UNIT) CAPS capsule Take 50,000 Units by mouth every 7 (seven) days.    [DISCONTINUED] amoxicillin (AMOXIL) 500 MG capsule SMARTSIG:4 Capsule(s) By Mouth   [DISCONTINUED] ALPRAZolam (XANAX) 0.5 MG tablet Take 0.25 mg by mouth daily as needed for anxiety or sleep.    [DISCONTINUED] ALPRAZolam (XANAX) 0.5 MG tablet Take 1 tablet by mouth once daily as needed   [DISCONTINUED] ALPRAZolam (XANAX) 0.5 MG tablet Take 1 tablet by mouth once daily as needed for anxiety.   [DISCONTINUED] ALPRAZolam (XANAX) 0.5 MG tablet Take 1 tablet (0.5 mg total) by mouth daily as needed for anxiety.   [DISCONTINUED] betamethasone valerate ointment (VALISONE) 0.1 % Apply 1 application topically 2 (two) times daily. Use for 2 weeks at a time as needed.  You may use this ointment twice a week for a maintenance dose.   [DISCONTINUED] escitalopram (LEXAPRO) 20 MG tablet Take 20 mg by mouth daily.    [DISCONTINUED] escitalopram (LEXAPRO) 20 MG tablet Take 2 tablets by mouth once daily.   [DISCONTINUED] gabapentin  (NEURONTIN) 100 MG capsule Take 100 mg by mouth 3 (three) times daily.   [DISCONTINUED] oxybutynin (DITROPAN-XL) 10 MG 24 hr tablet Take 10 mg by mouth daily.   [DISCONTINUED] risedronate (ACTONEL) 35 MG tablet Take 1 tablet (35 mg total) by mouth every 7 (seven) days. with water on empty stomach, nothing by mouth or lie down for next 30 minutes. (Patient taking differently: Take 35 mg by mouth every 7 (seven) days. Take with water on empty stomach, nothing by mouth or lie down for next 30 minutes.)   No facility-administered encounter medications on file as of 05/22/2023.   REVIEW OF SYSTEMS:  Gen: Denies fever, sweats or chills. No weight loss.  CV: Denies chest pain, palpitations or edema. Resp: Denies cough, shortness of breath of hemoptysis.  GI: See HPI. GU: Denies urinary burning, blood in  urine, increased urinary frequency or incontinence. MS: Denies joint pain, muscles aches or weakness. Derm: Denies rash, itchiness, skin lesions or unhealing ulcers. Psych: Denies depression, anxiety, memory loss or confusion. Heme: Denies bruising, easy bleeding. Neuro:  Denies headaches, dizziness or paresthesias. Endo:  Denies any problems with DM, thyroid or adrenal function.  PHYSICAL EXAM: BP 128/60   Pulse 94   Ht 5\' 4"  (1.626 m)   Wt 196 lb 9.6 oz (89.2 kg)   LMP 10/01/1994   BMI 33.75 kg/m  General: 77 year old female in no acute distress. Head: Normocephalic and atraumatic. Eyes:  Sclerae non-icteric, conjunctive pink. Ears: Normal auditory acuity. Mouth: Dentition intact. No ulcers or lesions.  Neck: Supple, no lymphadenopathy or thyromegaly.  Lungs: Clear bilaterally to auscultation without wheezes, crackles or rhonchi. Heart: Regular rate and rhythm. No murmur, rub or gallop appreciated.  Abdomen: Soft, nontender, nondistended. No masses. No hepatosplenomegaly. Normoactive bowel sounds x 4 quadrants.  Rectal: Deferred.  Musculoskeletal: Symmetrical with no gross  deformities. Skin: Warm and dry. No rash or lesions on visible extremities. Extremities: No edema. Neurological: Alert oriented x 4, no focal deficits.  Psychological:  Alert and cooperative. Normal mood and affect.  ASSESSMENT AND PLAN:  77 year old female with GERD symptoms for the past 6 months.  Patient is taking ASA 325 mg once daily for the past 3 weeks for headaches. -EGD benefits and risks discussed including risk with sedation, risk of bleeding, perforation and infection -Pepcid 20 mg 1 p.o. daily -Patient instructed to eat 3-4 small snack size meals daily -Follow-up with PCP for non-ASA headache treatment -Further recommendations to be determined after the above evaluation completed  Projectile vomiting  which occurs every 3 weeks for the past 6 months.  Normal LFTs and lipase level. -See plan above -CTAP if vomiting persist prior to EGD and colonoscopy -Patient denies any need for antiemetic  Colon cancer screening.  Normal colonoscopy 03/2008. Colonoscopy benefits and risks discussed including risk with sedation, risk of bleeding, perforation and infection   IBS, loose stools abate when she takes a fiber supplement -Fiber supplement Q day        CC:  Eartha Inch, MD

## 2023-05-22 NOTE — Progress Notes (Signed)
Agree with assessment/plan.  Raj Gupta, MD [facility] [phone]  

## 2023-05-22 NOTE — Patient Instructions (Addendum)
You have been scheduled for an endoscopy and colonoscopy. Please follow the written instructions given to you at your visit today.  Please pick up your prep supplies at the pharmacy within the next 1-3 days.  If you use inhalers (even only as needed), please bring them with you on the day of your procedure.  DO NOT TAKE 7 DAYS PRIOR TO TEST- Trulicity (dulaglutide) Ozempic, Wegovy (semaglutide) Mounjaro (tirzepatide) Bydureon Bcise (exanatide extended release)  DO NOT TAKE 1 DAY PRIOR TO YOUR TEST Rybelsus (semaglutide) Adlyxin (lixisenatide) Victoza (liraglutide) Byetta (exanatide) ___________________________________________________________________________  Contact Colleen,Np if your vomiting worsens prior to your EGD & colonoscopy date.  3-4 small snack sized meals daily  Pepcid 20 mg (over the counter)- 1 by mouth daily  Due to recent changes in healthcare laws, you may see the results of your imaging and laboratory studies on MyChart before your provider has had a chance to review them.  We understand that in some cases there may be results that are confusing or concerning to you. Not all laboratory results come back in the same time frame and the provider may be waiting for multiple results in order to interpret others.  Please give [place] 48 hours in order for your provider to thoroughly review all the results before contacting the office for clarification of your results.   Thank you for trusting me with your gastrointestinal care!   Alcide Evener, CRNP

## 2023-07-11 ENCOUNTER — Other Ambulatory Visit (HOSPITAL_BASED_OUTPATIENT_CLINIC_OR_DEPARTMENT_OTHER): Payer: Self-pay

## 2023-07-24 ENCOUNTER — Telehealth: Payer: Self-pay | Admitting: Nurse Practitioner

## 2023-07-24 NOTE — Telephone Encounter (Signed)
PT is requesting to have instruction resent to her by mail.

## 2023-07-24 NOTE — Telephone Encounter (Signed)
Contacted pt & left a voicemail regarding instructions being sent via mail.

## 2023-07-31 ENCOUNTER — Ambulatory Visit: Payer: Medicare HMO | Admitting: Gastroenterology

## 2023-07-31 ENCOUNTER — Encounter: Payer: Self-pay | Admitting: Gastroenterology

## 2023-07-31 ENCOUNTER — Telehealth: Payer: Self-pay | Admitting: Gastroenterology

## 2023-07-31 VITALS — BP 128/48 | HR 78 | Temp 98.2°F | Resp 14 | Ht 64.0 in | Wt 196.0 lb

## 2023-07-31 DIAGNOSIS — Z1211 Encounter for screening for malignant neoplasm of colon: Secondary | ICD-10-CM

## 2023-07-31 DIAGNOSIS — D120 Benign neoplasm of cecum: Secondary | ICD-10-CM

## 2023-07-31 DIAGNOSIS — K259 Gastric ulcer, unspecified as acute or chronic, without hemorrhage or perforation: Secondary | ICD-10-CM

## 2023-07-31 DIAGNOSIS — K2100 Gastro-esophageal reflux disease with esophagitis, without bleeding: Secondary | ICD-10-CM

## 2023-07-31 DIAGNOSIS — D122 Benign neoplasm of ascending colon: Secondary | ICD-10-CM | POA: Diagnosis not present

## 2023-07-31 DIAGNOSIS — K219 Gastro-esophageal reflux disease without esophagitis: Secondary | ICD-10-CM

## 2023-07-31 MED ORDER — PANTOPRAZOLE SODIUM 40 MG PO TBEC: 40.0000 mg | DELAYED_RELEASE_TABLET | Freq: Every day | ORAL | 3 refills | Status: DC

## 2023-07-31 MED ORDER — SODIUM CHLORIDE 0.9 % IV SOLN: 500.0000 mL | INTRAVENOUS | Status: DC

## 2023-07-31 NOTE — Op Note (Signed)
[facility] Patient Name: Bennett Kauzlarich Procedure Date: 07/31/2023 10:27 AM MRN: [id number] Endoscopist: Lynann Bologna , MD, [id number] Age: 77 Referring MD:  Date of Birth: [birth year] Gender: Female Account #: [id number] Procedure:                Colonoscopy Indications:              Screening for colorectal malignant neoplasm Medicines:                Monitored Anesthesia Care Procedure:                Pre-Anesthesia Assessment:                           - Prior to the procedure, a History and Physical                            was performed, and patient medications and                            allergies were reviewed. The patient's tolerance of                            previous anesthesia was also reviewed. The risks                            and benefits of the procedure and the sedation                            options and risks were discussed with the patient.                            All questions were answered, and informed consent                            was obtained. Prior Anticoagulants: The patient has                            taken no anticoagulant or antiplatelet agents. ASA                            Grade Assessment: II - A patient with mild systemic                            disease. After reviewing the risks and benefits,                            the patient was deemed in satisfactory condition to                            undergo the procedure.                           After obtaining informed consent, the colonoscope  was passed under direct vision. Throughout the                            procedure, the patient's blood pressure, pulse, and                            oxygen saturations were monitored continuously. The                            Olympus Scope [id number] was introduced through the                            anus and advanced to the 2 cm into the ileum. The                            colonoscopy was  performed without difficulty. The                            patient tolerated the procedure well. The quality                            of the bowel preparation was good. The terminal                            ileum, ileocecal valve, appendiceal orifice, and                            rectum were photographed. Scope In: 10:58:13 AM Scope Out: 11:10:52 AM Scope Withdrawal Time: 0 hours 9 minutes 12 seconds  Total Procedure Duration: 0 hours 12 minutes 39 seconds  Findings:                 Two sessile polyps were found in the proximal                            ascending colon and cecum. The polyps were 4 to 5                            mm in size. These polyps were removed with a cold                            snare. Resection and retrieval were complete.                           Multiple medium-mouthed diverticula were found in                            the sigmoid colon, few in descending colon and                            ascending colon.                           Non-bleeding internal hemorrhoids were found during  retroflexion. The hemorrhoids were small and Grade                            I (internal hemorrhoids that do not prolapse).                           The terminal ileum appeared normal.                           The exam was otherwise without abnormality on                            direct and retroflexion views. Complications:            No immediate complications. Estimated Blood Loss:     Estimated blood loss: none. Impression:               - Two 4 to 5 mm polyps in the proximal ascending                            colon and in the cecum, removed with a cold snare.                            Resected and retrieved.                           - Mod predominantly sigmoid diverticulosis.                           - Non-bleeding internal hemorrhoids.                           - The examined portion of the ileum was normal.                            - The examination was otherwise normal on direct                            and retroflexion views. Recommendation:           - Patient has a contact number available for                            emergencies. The signs and symptoms of potential                            delayed complications were discussed with the                            patient. Return to normal activities tomorrow.                            Written discharge instructions were provided to the                            patient.                           -  Resume previous diet.                           - Continue present medications.                           - Await pathology results.                           - Repeat colonoscopy is not recommended for                            surveillance.                           - The findings and recommendations were discussed                            with the patient's family. Lynann Bologna, MD 07/31/2023 11:15:58 AM This report has been signed electronically.

## 2023-07-31 NOTE — Patient Instructions (Signed)
Please read handouts provided. Resume previous diet. Await pathology results. Use Protonix ( pantoprazole ) 40 mg daily. No aspirin, ibuprofen, naproxen, or other non-steriodal anti-inflammatory drugs. Continue present medications.  YOU HAD AN ENDOSCOPIC PROCEDURE TODAY AT THE [facility]:   Refer to the procedure report that was given to you for any specific questions about what was found during the examination.  If the procedure report does not answer your questions, please call your gastroenterologist to clarify.  If you requested that your care partner not be given the details of your procedure findings, then the procedure report has been included in a sealed envelope for you to review at your convenience later.  YOU SHOULD EXPECT: Some feelings of bloating in the abdomen. Passage of more gas than usual.  Walking can help get rid of the air that was put into your GI tract during the procedure and reduce the bloating. If you had a lower endoscopy (such as a colonoscopy or flexible sigmoidoscopy) you may notice spotting of blood in your stool or on the toilet paper. If you underwent a bowel prep for your procedure, you may not have a normal bowel movement for a few days.  Please Note:  You might notice some irritation and congestion in your nose or some drainage.  This is from the oxygen used during your procedure.  There is no need for concern and it should clear up in a day or so.  SYMPTOMS TO REPORT IMMEDIATELY:  Following lower endoscopy (colonoscopy or flexible sigmoidoscopy):  Excessive amounts of blood in the stool  Significant tenderness or worsening of abdominal pains  Swelling of the abdomen that is new, acute  Fever of 100F or higher  Following upper endoscopy (EGD)  Vomiting of blood or coffee ground material  New chest pain or pain under the shoulder blades  Painful or persistently difficult swallowing  New shortness of breath  Fever of 100F or higher  Black,  tarry-looking stools  For urgent or emergent issues, a gastroenterologist can be reached at any hour by calling [phone]. Do not use MyChart messaging for urgent concerns.    DIET:  We do recommend a small meal at first, but then you may proceed to your regular diet.  Drink plenty of fluids but you should avoid alcoholic beverages for 24 hours.  ACTIVITY:  You should plan to take it easy for the rest of today and you should NOT DRIVE or use heavy machinery until tomorrow (because of the sedation medicines used during the test).    FOLLOW UP: Our staff will call the number listed on your records the next business day following your procedure.  We will call around 7:15- 8:00 am to check on you and address any questions or concerns that you may have regarding the information given to you following your procedure. If we do not reach you, we will leave a message.     If any biopsies were taken you will be contacted by phone or by letter within the next 1-3 weeks.  Please call us at [phone] if you have not heard about the biopsies in 3 weeks.    SIGNATURES/CONFIDENTIALITY: You and/or your care partner have signed paperwork which will be entered into your electronic medical record.  These signatures attest to the fact that that the information above on your After Visit Summary has been reviewed and is understood.  Full responsibility of the confidentiality of this discharge information lies with you and/or your care-partner.

## 2023-07-31 NOTE — Telephone Encounter (Signed)
Received a call from this patient this morning stating that she vomited much of the water that she drank after she finished the second half of her Plenvu.  She also overslept by about 45 minutes.  She was able to drink all the Plenvu, and she states that her stools have been clear.  She states that her instructions indicate that she cannot have anything to drink after 730, and she was wondering if it was okay to drink any more water.  Her procedure is scheduled for 1030.   I informed the patient that it is okay to have some additional clear liquids up to 2 hours before her procedure time.  I thought it would be okay for her to have some additional sips of water to improve her prep and improve hydration, but she cannot have anything after 830.  She indicated understanding and appreciation of the advice.

## 2023-07-31 NOTE — Progress Notes (Signed)
Called to room to assist during endoscopic procedure.  Patient ID and intended procedure confirmed with present staff. Received instructions for my participation in the procedure from the performing physician.  

## 2023-07-31 NOTE — Op Note (Addendum)
[facility] Patient Name: Dawn Salinas Procedure Date: 07/31/2023 10:32 AM MRN: [id number] Endoscopist: Lynann Bologna , MD, [id number] Age: 77 Referring MD:  Date of Birth: [birth year] Gender: Female Account #: [id number] Procedure:                Upper GI endoscopy Indications:              Epigastric abdominal pain with H/O N/V Medicines:                Monitored Anesthesia Care Procedure:                Pre-Anesthesia Assessment:                           - Prior to the procedure, a History and Physical                            was performed, and patient medications and                            allergies were reviewed. The patient's tolerance of                            previous anesthesia was also reviewed. The risks                            and benefits of the procedure and the sedation                            options and risks were discussed with the patient.                            All questions were answered, and informed consent                            was obtained. Prior Anticoagulants: The patient has                            taken no anticoagulant or antiplatelet agents. ASA                            Grade Assessment: II - A patient with mild systemic                            disease. After reviewing the risks and benefits,                            the patient was deemed in satisfactory condition to                            undergo the procedure.                           After obtaining informed consent, the endoscope was  passed under direct vision. Throughout the                            procedure, the patient's blood pressure, pulse, and                            oxygen saturations were monitored continuously. The                            GIF [id number] #[id number] was introduced through the                            mouth, and advanced to the second part of duodenum.                            The upper GI  endoscopy was accomplished without                            difficulty. The patient tolerated the procedure                            well. Scope In: Scope Out: Findings:                 LA Grade A (one or more mucosal breaks less than 5                            mm, not extending between tops of 2 mucosal folds)                            esophagitis with no bleeding was found 38 cm from                            the incisors. Biopsies were taken with a cold                            forceps for histology.                           A small hiatal hernia was present.                           One non-bleeding superficial gastric ulcer with no                            stigmata of bleeding was found in the gastric                            antrum with mild surrounding gastritis. The lesion                            was 8 mm in largest dimension. Biopsies were taken  with a cold forceps for histology.                           The examined duodenum was normal. Biopsies for                            histology were taken with a cold forceps for                            evaluation of celiac disease.                           The exam was otherwise without abnormality. Complications:            No immediate complications. Estimated Blood Loss:     Estimated blood loss: none. Impression:               - LA Grade A reflux esophagitis with no bleeding.                            Biopsied.                           - Small hiatal hernia.                           - Non-bleeding gastric ulcer with no stigmata of                            bleeding. Biopsied.                           - Normal examined duodenum. Biopsied.                           - The examination was otherwise normal. Recommendation:           - Patient has a contact number available for                            emergencies. The signs and symptoms of potential                             delayed complications were discussed with the                            patient. Return to normal activities tomorrow.                            Written discharge instructions were provided to the                            patient.                           - Resume previous diet.                           -  Use Protonix (pantoprazole) 40 mg PO daily.                           - Await pathology results.                           - No aspirin, ibuprofen, naproxen, or other                            non-steroidal anti-inflammatory drugs.                           - The findings and recommendations were discussed                            with the patient's family. Lynann Bologna, MD 07/31/2023 11:12:14 AM This report has been signed electronically.

## 2023-07-31 NOTE — Progress Notes (Signed)
05/22/2023 Dawn Salinas [id number] Aug 30, 1946     CHIEF COMPLAINT: Schedule a colonoscopy    HISTORY OF PRESENT ILLNESS: Dawn Salinas is a 77 year old female with a past medical history of asthma, depression, hyperlipidemia, hypertension, GERD and diverticulosis. She presents to our office today as referred by Chyrl Civatte PA-C to schedule a screening colonoscopy. She also endorses having worsening acid reflux symptoms with vomiting x 6 months. She describes having projectile vomiting which started 6 months ago, occurs once every 3 weeks. She first notices her mouth starts to water and shortly after she has  projectile vomit.  Emesis consists of food she just ate or liquid. No coffee ground or hematemesis. She has intermittent heartburn. She sometimes feels like a small piece of food gets stuck in her throat results and difficulty speaking and she coughs a bit.  She waits a minute or 2 then these symptoms abate and she is able to resume eating her meal.  No significant abdominal pain.  She passes a watery to loose stool she does not take a fiber supplement.  No black stools.  She infrequently sees a small amount of bright red blood on the toilet tissue which she attributes to having hemorrhoids.  She underwent a colonoscopy 03/2008 which was normal.  She is taking ASA 325 mg once daily for the past 3 weeks due to having headaches.  Her blood pressure has recently been elevated and she has seen her PCP regarding this issue.  She is taking Amlodipine-Benzapril  10-20mg  daily.   Labs 03/20/2023: WBC 9.0.  Hemoglobin 13.  Hematocrit 39.3.  MCV 89.  Platelet 348.  Glucose 135.  BUN 20.  Creatinine 0.92.  Sodium 139.  Potassium 4.9.  Total bili 0.3.  Alk phos 67.  AST 17.  ALT 13.  Lipase 23.     PAST GI PROCEDURES:   Colonoscopy 03/22/2008:  Normal colonoscopy   EGD 07/18/1999: Normal EGD       Past Medical History:  Diagnosis Date   Allergic rhinitis     Asthma     Depression      Diverticulosis of colon (without mention of hemorrhage) 03/2008    Colonoscopy    GERD (gastroesophageal reflux disease) 10/31/2012    Earlier Dx. with Asthma, but it was due to GERD by Dr. Shan Levans   Hip pain, acute 11/2012    Bursitis, got cortisone injection at Dr. Darrelyn Hillock   Hyperlipidemia     Hypertension               Past Surgical History:  Procedure Laterality Date   APPENDECTOMY       HYSTEROSCOPY   10/01/1994    TAH secondary to Endometriosisand ovarian cyctectomy   Imstem Bilateral      Bladder Control   NECK SURGERY   10/02/2003    fusion C 4-5   TOTAL KNEE ARTHROPLASTY Right 10/01/1994   TOTAL SHOULDER ARTHROPLASTY Left 10/21/2015    Procedure: TOTAL SHOULDER ARTHROPLASTY;  Surgeon: Beverely Low, MD;  Location: [facility] OR;  Service: Orthopedics;  Laterality: Left;        Social History: She is widowed.  She has 1 son.  She is retired.  Nonsmoker. No alcohol use. No drug use.    Family History: family history includes Asthma in her sister; CAD in her brother; Emphysema in her sister; Heart disease in her father, maternal grandmother, and mother; Hypertension in her mother and sister.   Allergies  Allergies  Allergen Reactions   Mirabegron Other (See Comments)      Isolated hip joint/tendon pain, decreased strength.   Cephalexin Itching and Rash   Codeine Itching and Rash      REACTION: rash, itching, hives              Outpatient Encounter Medications as of 05/22/2023  Medication Sig   albuterol (PROVENTIL HFA;VENTOLIN HFA) 108 (90 Base) MCG/ACT inhaler Inhale 2 puffs into the lungs every 4 (four) hours as needed for wheezing or shortness of breath.    ALPRAZolam (XANAX) 0.5 MG tablet Take 1 tablet (0.5 mg total) by mouth daily as needed for anxiety.   Alum Hydroxide-Mag Carbonate 160-105 MG CHEW Chew 1 tablet by mouth daily.    amLODipine (NORVASC) 5 MG tablet TAKE 1 TABLET BY MOUTH EVERY DAY (FOR MORE REFILLS MAKE OFFICE VISIT) (Patient taking  differently: Take 10 mg by mouth daily. TAKE 1 TABLET BY MOUTH EVERY DAY (FOR MORE REFILLS MAKE OFFICE VISIT))   amoxicillin (AMOXIL) 500 MG capsule Take 4 capsules by mouth 1 hour prior to appointment.   calcium-vitamin D (OSCAL WITH D) 250-125 MG-UNIT tablet Take 1 tablet by mouth daily.    candesartan (ATACAND) 32 MG tablet Take 32 mg by mouth daily.    diphenhydrAMINE HCl (BENADRYL ALLERGY PO) Take 1 tablet by mouth daily as needed (allergy).    escitalopram (LEXAPRO) 20 MG tablet Take 2 tablets (40 mg total) by mouth daily.   estradiol (ESTRACE) 0.1 MG/GM vaginal cream estradiol 0.01% (0.1 mg/gram) vaginal cream   famotidine (PEPCID) 20 MG tablet Take 20 mg by mouth daily.    levocetirizine (XYZAL) 5 MG tablet Take 5 mg by mouth every evening.   Sod Picosulfate-Mag Ox-Cit Acd (CLENPIQ) 10-3.5-12 MG-GM -GM/160ML SOLN Take 1 kit by mouth as directed.   traMADol (ULTRAM) 50 MG tablet Take 1 tablet by mouth daily as needed for moderate pain.    Vitamin D, Ergocalciferol, (DRISDOL) 1.25 MG (50000 UNIT) CAPS capsule Take 50,000 Units by mouth every 7 (seven) days.    [DISCONTINUED] amoxicillin (AMOXIL) 500 MG capsule SMARTSIG:4 Capsule(s) By Mouth   [DISCONTINUED] ALPRAZolam (XANAX) 0.5 MG tablet Take 0.25 mg by mouth daily as needed for anxiety or sleep.    [DISCONTINUED] ALPRAZolam (XANAX) 0.5 MG tablet Take 1 tablet by mouth once daily as needed   [DISCONTINUED] ALPRAZolam (XANAX) 0.5 MG tablet Take 1 tablet by mouth once daily as needed for anxiety.   [DISCONTINUED] ALPRAZolam (XANAX) 0.5 MG tablet Take 1 tablet (0.5 mg total) by mouth daily as needed for anxiety.   [DISCONTINUED] betamethasone valerate ointment (VALISONE) 0.1 % Apply 1 application topically 2 (two) times daily. Use for 2 weeks at a time as needed.  You may use this ointment twice a week for a maintenance dose.   [DISCONTINUED] escitalopram (LEXAPRO) 20 MG tablet Take 20 mg by mouth daily.    [DISCONTINUED] escitalopram  (LEXAPRO) 20 MG tablet Take 2 tablets by mouth once daily.   [DISCONTINUED] gabapentin (NEURONTIN) 100 MG capsule Take 100 mg by mouth 3 (three) times daily.   [DISCONTINUED] oxybutynin (DITROPAN-XL) 10 MG 24 hr tablet Take 10 mg by mouth daily.   [DISCONTINUED] risedronate (ACTONEL) 35 MG tablet Take 1 tablet (35 mg total) by mouth every 7 (seven) days. with water on empty stomach, nothing by mouth or lie down for next 30 minutes. (Patient taking differently: Take 35 mg by mouth every 7 (seven) days. Take with water on empty stomach,  nothing by mouth or lie down for next 30 minutes.)      No facility-administered encounter medications on file as of 05/22/2023.      REVIEW OF SYSTEMS:  Gen: Denies fever, sweats or chills. No weight loss.  CV: Denies chest pain, palpitations or edema. Resp: Denies cough, shortness of breath of hemoptysis.  GI: See HPI. GU: Denies urinary burning, blood in urine, increased urinary frequency or incontinence. MS: Denies joint pain, muscles aches or weakness. Derm: Denies rash, itchiness, skin lesions or unhealing ulcers. Psych: Denies depression, anxiety, memory loss or confusion. Heme: Denies bruising, easy bleeding. Neuro:  Denies headaches, dizziness or paresthesias. Endo:  Denies any problems with DM, thyroid or adrenal function.   PHYSICAL EXAM: BP 128/60   Pulse 94   Ht 5\' 4"  (1.626 m)   Wt 196 lb 9.6 oz (89.2 kg)   LMP 10/01/1994   BMI 33.75 kg/m  General: 77 year old female in no acute distress. Head: Normocephalic and atraumatic. Eyes:  Sclerae non-icteric, conjunctive pink. Ears: Normal auditory acuity. Mouth: Dentition intact. No ulcers or lesions.  Neck: Supple, no lymphadenopathy or thyromegaly.  Lungs: Clear bilaterally to auscultation without wheezes, crackles or rhonchi. Heart: Regular rate and rhythm. No murmur, rub or gallop appreciated.  Abdomen: Soft, nontender, nondistended. No masses. No hepatosplenomegaly. Normoactive bowel  sounds x 4 quadrants.  Rectal: Deferred.  Musculoskeletal: Symmetrical with no gross deformities. Skin: Warm and dry. No rash or lesions on visible extremities. Extremities: No edema. Neurological: Alert oriented x 4, no focal deficits.  Psychological:  Alert and cooperative. Normal mood and affect.   ASSESSMENT AND PLAN:   77 year old female with GERD symptoms for the past 6 months.  Patient is taking ASA 325 mg once daily for the past 3 weeks for headaches. -EGD benefits and risks discussed including risk with sedation, risk of bleeding, perforation and infection -Pepcid 20 mg 1 p.o. daily -Patient instructed to eat 3-4 small snack size meals daily -Follow-up with PCP for non-ASA headache treatment -Further recommendations to be determined after the above evaluation completed   Projectile vomiting  which occurs every 3 weeks for the past 6 months.  Normal LFTs and lipase level. -See plan above -CTAP if vomiting persist prior to EGD and colonoscopy -Patient denies any need for antiemetic   Colon cancer screening.  Normal colonoscopy 03/2008. Colonoscopy benefits and risks discussed including risk with sedation, risk of bleeding, perforation and infection   IBS, loose stools abate when she takes a fiber supplement -Fiber supplement Q day       Attending physician's note   I have taken history, reviewed the chart and examined the patient. I performed a substantive portion of this encounter, including complete performance of at least one of the key components, in conjunction with the APP. I agree with the Advanced Practitioner's note, impression and recommendations.   For EGD/colon   Edman Circle, MD [facility] [phone]

## 2023-07-31 NOTE — Progress Notes (Signed)
Vss nad trans to pacu 

## 2023-08-01 ENCOUNTER — Telehealth: Payer: Self-pay | Admitting: *Deleted

## 2023-08-01 NOTE — Telephone Encounter (Signed)
  Follow up Call-     07/31/2023    9:58 AM  Call back number  Post procedure Call Back phone  # [phone]  Permission to leave phone message Yes   [facility]

## 2023-08-02 LAB — SURGICAL PATHOLOGY

## 2023-08-12 ENCOUNTER — Encounter: Payer: Self-pay | Admitting: Gastroenterology

## 2023-09-10 ENCOUNTER — Encounter: Payer: Self-pay | Admitting: Gastroenterology

## 2023-09-28 ENCOUNTER — Other Ambulatory Visit: Payer: Self-pay

## 2023-09-28 ENCOUNTER — Emergency Department (HOSPITAL_BASED_OUTPATIENT_CLINIC_OR_DEPARTMENT_OTHER)
Admission: EM | Admit: 2023-09-28 | Discharge: 2023-09-28 | Disposition: A | Discharge status: Home / Self Care | Payer: Medicare HMO

## 2023-09-28 ENCOUNTER — Encounter (HOSPITAL_BASED_OUTPATIENT_CLINIC_OR_DEPARTMENT_OTHER): Payer: Self-pay

## 2023-09-28 ENCOUNTER — Emergency Department (HOSPITAL_BASED_OUTPATIENT_CLINIC_OR_DEPARTMENT_OTHER): Payer: Medicare HMO

## 2023-09-28 DIAGNOSIS — Z79899 Other long term (current) drug therapy: Secondary | ICD-10-CM | POA: Diagnosis not present

## 2023-09-28 DIAGNOSIS — W133XXA Fall through floor, initial encounter: Secondary | ICD-10-CM | POA: Diagnosis not present

## 2023-09-28 DIAGNOSIS — S060X0A Concussion without loss of consciousness, initial encounter: Secondary | ICD-10-CM | POA: Diagnosis not present

## 2023-09-28 DIAGNOSIS — S0990XA Unspecified injury of head, initial encounter: Secondary | ICD-10-CM | POA: Diagnosis present

## 2023-09-28 MED ORDER — ACETAMINOPHEN 500 MG PO TABS
1000.0000 mg | ORAL_TABLET | Freq: Once | ORAL | Status: AC
  Administered 2023-09-28: 1000 mg via ORAL
  Filled 2023-09-28: qty 2

## 2023-09-28 NOTE — Discharge Instructions (Addendum)
Your CT of your head was negative you do not have any traumatic injuries.  As such, we feel that you are safe for discharge.  He may take over-the-counter Tylenol for pain.  Please follow-up with your primary doctor.

## 2023-09-28 NOTE — ED Triage Notes (Signed)
Pt BIB [facility] for [place] mechanical [place] cardiac monitor. Pt reports floor was wet and she slipped, hitting left side of head on some plastic furniture. Endorses pain to left side of head. Denies dizziness, blurred vision, or LOC. No thinners, skin intact.

## 2023-09-28 NOTE — ED Notes (Signed)
Pt CA&Ox4, in NAD, and ambulatory with steady gait at time of discharge. Pt d/c instructions reviewed with patient, pt verbalized understanding and had no further questions at time of discharge. Pt stated her brother would be picking her up and transporting her home.

## 2023-09-28 NOTE — ED Provider Notes (Signed)
[facility] Provider Note   CSN: [id number] Arrival date & time: 09/28/23  1155     History  Chief Complaint  Patient presents with   Dawn Salinas is a 77 y.o. female.  46 old female present emergency department for mechanical fall [place] [employer] [place].  She slipped on the floor, hit her head on some plastic furniture.  No LOC.  No nausea or vomiting.  Not on blood thinners.  Denied pain otherwise.   Fall       Home Medications Prior to Admission medications   Medication Sig Start Date End Date Taking? Authorizing Provider  albuterol (PROVENTIL HFA;VENTOLIN HFA) 108 (90 Base) MCG/ACT inhaler Inhale 2 puffs into the lungs every 4 (four) hours as needed for wheezing or shortness of breath.  05/31/16   [provider]  ALPRAZolam Prudy Feeler) 0.5 MG tablet Take 1 tablet (0.5 mg total) by mouth daily as needed for anxiety. 05/08/23     Alum Hydroxide-Mag Carbonate 160-105 MG CHEW Chew 1 tablet by mouth daily.     [provider]  amLODipine (NORVASC) 5 MG tablet TAKE 1 TABLET BY MOUTH EVERY DAY (FOR MORE REFILLS MAKE OFFICE VISIT) Patient taking differently: Take 10 mg by mouth daily. TAKE 1 TABLET BY MOUTH EVERY DAY (FOR MORE REFILLS MAKE OFFICE VISIT) 02/20/22   Lewayne Bunting, MD  amoxicillin (AMOXIL) 500 MG capsule Take 4 capsules by mouth 1 hour prior to appointment. Patient not taking: Reported on 07/31/2023 05/29/22     calcium-vitamin D (OSCAL WITH D) 250-125 MG-UNIT tablet Take 1 tablet by mouth daily.     [provider]  candesartan (ATACAND) 32 MG tablet Take 32 mg by mouth daily.  05/09/18   [provider]  diphenhydrAMINE HCl (BENADRYL ALLERGY PO) Take 1 tablet by mouth daily as needed (allergy).     [provider]  escitalopram (LEXAPRO) 20 MG tablet Take 2 tablets (40 mg total) by mouth daily. 05/08/23     estradiol (ESTRACE) 0.1 MG/GM vaginal cream estradiol 0.01%  (0.1 mg/gram) vaginal cream 08/03/21   [provider]  famotidine (PEPCID) 20 MG tablet Take 20 mg by mouth daily.  02/16/20   [provider]  levocetirizine (XYZAL) 5 MG tablet Take 5 mg by mouth every evening.    [provider]  pantoprazole (PROTONIX) 40 MG tablet Take 1 tablet (40 mg total) by mouth daily. 07/31/23   Lynann Bologna, MD  traMADol (ULTRAM) 50 MG tablet Take 1 tablet by mouth daily as needed for moderate pain.     [provider]  Vitamin D, Ergocalciferol, (DRISDOL) 1.25 MG (50000 UNIT) CAPS capsule Take 50,000 Units by mouth every 7 (seven) days.  02/18/20   [provider]      Allergies    Mirabegron, Cephalexin, and Codeine    Review of Systems   Review of Systems  Physical Exam Updated Vital Signs BP (!) 165/69 (BP Location: Left Arm)   Pulse 79   Temp 98.7 F (37.1 C) (Oral)   Resp 17   Ht 5\' 4"  (1.626 m)   Wt 86.2 kg   LMP 10/01/1994   SpO2 96%   BMI 32.61 kg/m  Physical Exam Vitals reviewed.  Constitutional:      General: She is not in acute distress.    Appearance: She is obese. She is not toxic-appearing.  HENT:     Head: Normocephalic.  Nose: Nose normal.     Mouth/Throat:     Mouth: Mucous membranes are moist.  Eyes:     Pupils: Pupils are equal, round, and reactive to light.  Cardiovascular:     Rate and Rhythm: Normal rate and regular rhythm.  Pulmonary:     Effort: Pulmonary effort is normal.  Abdominal:     General: Abdomen is flat. There is no distension.     Palpations: Abdomen is soft.     Tenderness: There is no abdominal tenderness. There is no guarding or rebound.  Musculoskeletal:        General: Normal range of motion.     Comments: No midline spinal tenderness.  Chest wall stable nontender.  Pelvis stable nontender.  No bony tenderness in extremities.  Skin:    General: Skin is warm.     Capillary Refill: Capillary refill takes less than 2 seconds.  Neurological:     Mental  Status: She is alert and oriented to person, place, and time.  Psychiatric:        Mood and Affect: Mood normal.        Behavior: Behavior normal.     ED Results / Procedures / Treatments   Labs (all labs ordered are listed, but only abnormal results are displayed) Labs Reviewed - No data to display  EKG None  Radiology CT Head Wo Contrast Result Date: 09/28/2023 CLINICAL DATA:  77 year old female status post fall striking left head. EXAM: CT HEAD WITHOUT CONTRAST TECHNIQUE: Contiguous axial images were obtained from the base of the skull through the vertex without intravenous contrast. RADIATION DOSE REDUCTION: This exam was performed according to the departmental dose-optimization program which includes automated exposure control, adjustment of the mA and/or kV according to patient size and/or use of iterative reconstruction technique. COMPARISON:  Head CT 04/01/2020. FINDINGS: Brain: Cerebral volume remains normal for age. No midline shift, ventriculomegaly, mass effect, evidence of mass lesion, intracranial hemorrhage or evidence of cortically based acute infarction. Dilated perivascular space at the right lentiform (normal variant). Gray-white differentiation is stable and within normal limits for age. Vascular: Calcified atherosclerosis at the skull base. No suspicious intracranial vascular hyperdensity. Skull: Intact, no fracture identified. Sinuses/Orbits: Mild to moderate new bilateral paranasal sinus mucosal thickening, most pronounced at the left frontoethmoidal recess. No layering sinus hemorrhage. Tympanic cavities and mastoids remain clear. Other: No acute orbit or scalp soft tissue finding. IMPRESSION: 1. No acute traumatic injury identified. Stable and normal for age noncontrast CT appearance of the brain. 2. New bilateral paranasal sinus inflammation since 2021. Electronically Signed   By: Odessa Fleming M.D.   On: 09/28/2023 12:42    Procedures Procedures    Medications Ordered in  ED Medications  acetaminophen (TYLENOL) tablet 1,000 mg (1,000 mg Oral Given 09/28/23 1221)    ED Course/ Medical Decision Making/ A&P                                 Medical Decision Making Well-appearing 77 year old female presenting emergency department [place].  Afebrile vital signs reassuring.  Per chart review does not appear to be on blood thinners.  CT scan of head today given patient's age was negative.  Given Tylenol for pain.  Consider labs, however given mechanical nature and patient with stable vitals no other obvious signs of injury.  Labs unlikely to change management or disposition at this time.  Stable  for discharge.  Amount and/or Complexity of Data Reviewed Radiology: ordered.  Risk OTC drugs.          Final Clinical Impression(s) / ED Diagnoses Final diagnoses:  None    Rx / DC Orders ED Discharge Orders     None         Coral Spikes, DO 09/28/23 1255

## 2023-11-22 ENCOUNTER — Other Ambulatory Visit (HOSPITAL_BASED_OUTPATIENT_CLINIC_OR_DEPARTMENT_OTHER): Payer: Self-pay

## 2023-11-22 MED ORDER — ESCITALOPRAM OXALATE 20 MG PO TABS
40.0000 mg | ORAL_TABLET | Freq: Every day | ORAL | 0 refills | Status: DC
  Filled 2023-11-22: qty 180, 90d supply, fill #0

## 2023-11-26 ENCOUNTER — Other Ambulatory Visit (HOSPITAL_BASED_OUTPATIENT_CLINIC_OR_DEPARTMENT_OTHER): Payer: Self-pay

## 2023-12-05 ENCOUNTER — Other Ambulatory Visit (HOSPITAL_BASED_OUTPATIENT_CLINIC_OR_DEPARTMENT_OTHER): Payer: Self-pay

## 2023-12-05 MED ORDER — METFORMIN HCL ER 500 MG PO TB24
500.0000 mg | ORAL_TABLET | Freq: Every day | ORAL | 0 refills | Status: DC
  Filled 2023-12-05: qty 30, 30d supply, fill #0

## 2023-12-17 ENCOUNTER — Other Ambulatory Visit (HOSPITAL_BASED_OUTPATIENT_CLINIC_OR_DEPARTMENT_OTHER): Payer: Self-pay

## 2023-12-20 ENCOUNTER — Other Ambulatory Visit (HOSPITAL_BASED_OUTPATIENT_CLINIC_OR_DEPARTMENT_OTHER): Payer: Self-pay

## 2023-12-21 ENCOUNTER — Other Ambulatory Visit (HOSPITAL_BASED_OUTPATIENT_CLINIC_OR_DEPARTMENT_OTHER): Payer: Self-pay

## 2023-12-21 ENCOUNTER — Encounter (HOSPITAL_BASED_OUTPATIENT_CLINIC_OR_DEPARTMENT_OTHER): Payer: Self-pay

## 2024-01-02 ENCOUNTER — Other Ambulatory Visit (HOSPITAL_BASED_OUTPATIENT_CLINIC_OR_DEPARTMENT_OTHER): Payer: Self-pay

## 2024-01-02 MED ORDER — METFORMIN HCL ER 500 MG PO TB24
500.0000 mg | ORAL_TABLET | Freq: Every day | ORAL | 0 refills | Status: DC
  Filled 2024-01-02 – 2024-01-09 (×2): qty 30, 30d supply, fill #0

## 2024-01-09 ENCOUNTER — Other Ambulatory Visit (HOSPITAL_BASED_OUTPATIENT_CLINIC_OR_DEPARTMENT_OTHER): Payer: Self-pay

## 2024-01-09 MED ORDER — NYSTATIN 100000 UNIT/GM EX POWD
1.0000 | Freq: Three times a day (TID) | CUTANEOUS | 0 refills | Status: AC
Start: 2024-01-09 — End: ?
  Filled 2024-01-09: qty 60, 90d supply, fill #0

## 2024-02-03 ENCOUNTER — Other Ambulatory Visit (HOSPITAL_BASED_OUTPATIENT_CLINIC_OR_DEPARTMENT_OTHER): Payer: Self-pay

## 2024-02-03 MED ORDER — ESCITALOPRAM OXALATE 20 MG PO TABS
40.0000 mg | ORAL_TABLET | Freq: Every day | ORAL | 0 refills | Status: AC
  Filled 2024-02-03 – 2024-02-21 (×2): qty 180, 90d supply, fill #0

## 2024-02-03 MED ORDER — ALPRAZOLAM 0.5 MG PO TABS
0.5000 mg | ORAL_TABLET | Freq: Every day | ORAL | 0 refills | Status: AC | PRN
  Filled 2024-02-03 – 2024-02-18 (×2): qty 30, 30d supply, fill #0

## 2024-02-04 ENCOUNTER — Other Ambulatory Visit: Payer: Self-pay

## 2024-02-04 ENCOUNTER — Other Ambulatory Visit (HOSPITAL_BASED_OUTPATIENT_CLINIC_OR_DEPARTMENT_OTHER): Payer: Self-pay

## 2024-02-14 ENCOUNTER — Other Ambulatory Visit (HOSPITAL_BASED_OUTPATIENT_CLINIC_OR_DEPARTMENT_OTHER): Payer: Self-pay

## 2024-02-18 ENCOUNTER — Other Ambulatory Visit (HOSPITAL_BASED_OUTPATIENT_CLINIC_OR_DEPARTMENT_OTHER): Payer: Self-pay

## 2024-02-21 ENCOUNTER — Other Ambulatory Visit (HOSPITAL_BASED_OUTPATIENT_CLINIC_OR_DEPARTMENT_OTHER): Payer: Self-pay

## 2024-03-05 ENCOUNTER — Other Ambulatory Visit (HOSPITAL_COMMUNITY): Payer: Self-pay

## 2024-03-05 ENCOUNTER — Other Ambulatory Visit (HOSPITAL_BASED_OUTPATIENT_CLINIC_OR_DEPARTMENT_OTHER): Payer: Self-pay

## 2024-03-05 ENCOUNTER — Other Ambulatory Visit: Payer: Self-pay

## 2024-03-05 MED ORDER — PREDNISONE 10 MG PO TABS
ORAL_TABLET | ORAL | 0 refills | Status: AC
  Filled 2024-03-05 (×2): qty 42, 21d supply, fill #0

## 2024-03-06 ENCOUNTER — Other Ambulatory Visit (HOSPITAL_BASED_OUTPATIENT_CLINIC_OR_DEPARTMENT_OTHER): Payer: Self-pay

## 2024-04-01 ENCOUNTER — Other Ambulatory Visit (HOSPITAL_BASED_OUTPATIENT_CLINIC_OR_DEPARTMENT_OTHER): Payer: Self-pay

## 2024-04-22 ENCOUNTER — Other Ambulatory Visit: Payer: Self-pay | Admitting: Gastroenterology

## 2024-07-16 ENCOUNTER — Other Ambulatory Visit (HOSPITAL_BASED_OUTPATIENT_CLINIC_OR_DEPARTMENT_OTHER): Payer: Self-pay

## 2024-07-16 MED ORDER — METHYLPREDNISOLONE 4 MG PO TBPK
ORAL_TABLET | ORAL | 0 refills | Status: DC
  Filled 2024-07-16: qty 21, 6d supply, fill #0

## 2024-07-17 ENCOUNTER — Other Ambulatory Visit (HOSPITAL_BASED_OUTPATIENT_CLINIC_OR_DEPARTMENT_OTHER): Payer: Self-pay

## 2024-07-17 MED ORDER — PREDNISONE 10 MG PO TABS
ORAL_TABLET | ORAL | 0 refills | Status: AC
  Filled 2024-07-17: qty 42, 21d supply, fill #0

## 2024-07-21 ENCOUNTER — Other Ambulatory Visit (HOSPITAL_BASED_OUTPATIENT_CLINIC_OR_DEPARTMENT_OTHER): Payer: Self-pay

## 2024-09-17 ENCOUNTER — Other Ambulatory Visit (HOSPITAL_BASED_OUTPATIENT_CLINIC_OR_DEPARTMENT_OTHER): Payer: Self-pay

## 2024-09-17 MED ORDER — METHENAMINE HIPPURATE 1 G PO TABS
1.0000 g | ORAL_TABLET | Freq: Two times a day (BID) | ORAL | 11 refills | Status: AC
  Filled 2024-09-17 – 2024-10-02 (×3): qty 60, 30d supply, fill #0

## 2024-09-21 ENCOUNTER — Other Ambulatory Visit: Payer: Self-pay

## 2024-09-21 ENCOUNTER — Other Ambulatory Visit (HOSPITAL_BASED_OUTPATIENT_CLINIC_OR_DEPARTMENT_OTHER): Payer: Self-pay

## 2024-09-21 MED ORDER — CIPROFLOXACIN HCL 500 MG PO TABS
500.0000 mg | ORAL_TABLET | Freq: Two times a day (BID) | ORAL | 0 refills | Status: AC
  Filled 2024-09-21: qty 14, 7d supply, fill #0

## 2024-09-21 MED ORDER — CANDESARTAN CILEXETIL 32 MG PO TABS
32.0000 mg | ORAL_TABLET | Freq: Every day | ORAL | 1 refills | Status: AC
  Filled 2024-09-21: qty 90, 90d supply, fill #0

## 2024-09-21 MED ORDER — CYCLOBENZAPRINE HCL 5 MG PO TABS: 5.0000 mg | ORAL_TABLET | Freq: Every evening | ORAL | 1 refills | Status: AC | PRN

## 2024-09-21 MED ORDER — EZETIMIBE 10 MG PO TABS
10.0000 mg | ORAL_TABLET | Freq: Every day | ORAL | 1 refills | Status: AC
  Filled 2024-09-21: qty 90, 90d supply, fill #0

## 2024-09-26 ENCOUNTER — Other Ambulatory Visit (HOSPITAL_BASED_OUTPATIENT_CLINIC_OR_DEPARTMENT_OTHER): Payer: Self-pay

## 2024-09-29 ENCOUNTER — Other Ambulatory Visit (HOSPITAL_BASED_OUTPATIENT_CLINIC_OR_DEPARTMENT_OTHER): Payer: Self-pay

## 2024-10-02 ENCOUNTER — Other Ambulatory Visit (HOSPITAL_BASED_OUTPATIENT_CLINIC_OR_DEPARTMENT_OTHER): Payer: Self-pay

## 2024-10-16 ENCOUNTER — Other Ambulatory Visit (HOSPITAL_BASED_OUTPATIENT_CLINIC_OR_DEPARTMENT_OTHER): Payer: Self-pay
